# Patient Record
Sex: Male | Born: 1975 | Race: White | Hispanic: No | Smoking: Never smoker
Health system: Southern US, Community
[De-identification: ages and names within clinical notes are randomized; demographics above are authoritative.]

## PROBLEM LIST (undated history)

## (undated) DIAGNOSIS — K420 Umbilical hernia with obstruction, without gangrene: Secondary | ICD-10-CM

## (undated) DIAGNOSIS — R011 Cardiac murmur, unspecified: Secondary | ICD-10-CM

## (undated) HISTORY — DX: Cardiac murmur, unspecified: R01.1

---
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
## 2011-01-05 ENCOUNTER — Encounter: Payer: Self-pay | Admitting: Internal Medicine

## 2011-01-05 ENCOUNTER — Ambulatory Visit (INDEPENDENT_AMBULATORY_CARE_PROVIDER_SITE_OTHER): Payer: Managed Care, Other (non HMO) | Admitting: Internal Medicine

## 2011-01-05 VITALS — BP 124/80 | HR 86 | Temp 98.3°F | Resp 18 | Ht 71.0 in | Wt 196.0 lb

## 2011-01-05 DIAGNOSIS — Z0000 Encounter for general adult medical examination without abnormal findings: Secondary | ICD-10-CM

## 2011-01-05 HISTORY — PX: NO PAST SURGERIES: SHX2092

## 2011-01-05 NOTE — Patient Instructions (Addendum)
Please schedule cbc, chem7, lft, lipid, tsh, urinalysis v70.0 for this coming Monday (01/08/2011) Please schedule same labs for next year's physical

## 2011-01-05 NOTE — Progress Notes (Signed)
  Subjective:    Patient ID: David [name], male    DOB: 06-Jan-1976, 35 y.o.   MRN: [id number]  HPI Pt presents to clinic for physical exam. Has no current active complaints. Has h/o mild hyperlipidemia that has not required medication in the past. Performs regular exercise.   Reviewed pmh, psh, medications, allergies, soc hx and fam hx    Review of Systems  Respiratory: Negative for cough and shortness of breath.   Cardiovascular: Negative for chest pain.  Gastrointestinal: Negative for abdominal pain.  All other systems reviewed and are negative.       Objective:   Physical Exam  Physical Exam  Nursing note and vitals reviewed. Constitutional: He appears well-developed and well-nourished. No distress.  HENT:  Head: Normocephalic and atraumatic.  Right Ear: Tympanic membrane and external ear normal.  Left Ear: Tympanic membrane and external ear normal.  Nose: Nose normal.  Mouth/Throat: Uvula is midline, oropharynx is clear and moist and mucous membranes are normal. No oropharyngeal exudate.  Eyes: Conjunctivae and EOM are normal. Pupils are equal, round, and reactive to light. Right eye exhibits no discharge. Left eye exhibits no discharge. No scleral icterus.  Neck: Neck supple. Carotid bruit is not present. No thyromegaly present.  Cardiovascular: Normal rate, regular rhythm and normal heart sounds.  Exam reveals no gallop and no friction rub.   No murmur heard. Pulmonary/Chest: Effort normal and breath sounds normal. No respiratory distress. He has no wheezes. He has no rales.  Abdominal: Soft. He exhibits no distension and no mass. There is no hepatosplenomegaly. There is no tenderness. There is no rebound. Hernia confirmed negative in the right inguinal area and confirmed negative in the left inguinal area.  Genitourinary: Rectum normal, prostate normal and testes normal. Rectal exam shows no mass and no tenderness. Guaiac negative stool. Prostate is not enlarged and not  tender. Right testis shows no mass, no swelling and no tenderness. Right testis is descended. Left testis shows no mass, no swelling and no tenderness. Left testis is descended.  Lymphadenopathy:    He has no cervical adenopathy.       Right: No inguinal adenopathy present.       Left: No inguinal adenopathy present.  Neurological: He is alert.  Skin: Skin is warm and dry. He is not diaphoretic.  Psychiatric: He has a normal mood and affect.        Assessment & Plan:

## 2011-01-07 DIAGNOSIS — Z0000 Encounter for general adult medical examination without abnormal findings: Secondary | ICD-10-CM | POA: Insufficient documentation

## 2011-01-07 NOTE — Assessment & Plan Note (Signed)
Nl exam. Obtain screening labs. 

## 2011-01-08 ENCOUNTER — Other Ambulatory Visit: Payer: Self-pay | Admitting: Internal Medicine

## 2011-01-08 DIAGNOSIS — Z0000 Encounter for general adult medical examination without abnormal findings: Secondary | ICD-10-CM

## 2011-01-08 LAB — URINALYSIS, ROUTINE W REFLEX MICROSCOPIC
Bilirubin Urine: NEGATIVE
Nitrite: NEGATIVE
Specific Gravity, Urine: 1.026 (ref 1.005–1.030)
Urobilinogen, UA: 0.2 mg/dL (ref 0.0–1.0)
pH: 8 (ref 5.0–8.0)

## 2011-01-08 LAB — HEPATIC FUNCTION PANEL
ALT: 56 U/L — ABNORMAL HIGH (ref 0–53)
Albumin: 4.6 g/dL (ref 3.5–5.2)
Indirect Bilirubin: 0.4 mg/dL (ref 0.0–0.9)
Total Protein: 7.3 g/dL (ref 6.0–8.3)

## 2011-01-08 LAB — CBC
HCT: 50.4 % (ref 39.0–52.0)
Hemoglobin: 17.3 g/dL — ABNORMAL HIGH (ref 13.0–17.0)
MCH: 31.5 pg (ref 26.0–34.0)
MCV: 91.8 fL (ref 78.0–100.0)
Platelets: 157 10*3/uL (ref 150–400)
RBC: 5.49 MIL/uL (ref 4.22–5.81)
WBC: 4.9 10*3/uL (ref 4.0–10.5)

## 2011-01-08 LAB — BASIC METABOLIC PANEL
BUN: 19 mg/dL (ref 6–23)
CO2: 28 mEq/L (ref 19–32)
Calcium: 9.7 mg/dL (ref 8.4–10.5)
Chloride: 103 mEq/L (ref 96–112)
Creat: 0.88 mg/dL (ref 0.50–1.35)
Glucose, Bld: 102 mg/dL — ABNORMAL HIGH (ref 70–99)

## 2011-01-08 LAB — LIPID PANEL
LDL Cholesterol: 159 mg/dL — ABNORMAL HIGH (ref 0–99)
Triglycerides: 157 mg/dL — ABNORMAL HIGH (ref <150)

## 2011-01-10 ENCOUNTER — Telehealth: Payer: Self-pay | Admitting: *Deleted

## 2011-01-10 DIAGNOSIS — R945 Abnormal results of liver function studies: Secondary | ICD-10-CM

## 2011-01-10 NOTE — Telephone Encounter (Signed)
Has elevated chol. Don't recommend medication at this level or his age. Low fat diet and regular aerobic exercise. Oatmeal may help. -mild elevation of liver tests. Recommend repeating in a few weeks lft dx-abn lft -blood sugar minimally above nl. 2 pts only. Attention to healthy low sugar/carb diet and exercise

## 2011-01-10 NOTE — Telephone Encounter (Signed)
Patient called requesting lab results. He was informed once labs have been reviewed by provider, the call would be returned to him.

## 2011-01-12 NOTE — Telephone Encounter (Signed)
Call placed to patient [phone], he was informed per Dr Rodena Medin instructions and has verbalized understanding. Lab order entered for December 2012.

## 2015-02-19 ENCOUNTER — Other Ambulatory Visit (HOSPITAL_COMMUNITY)
Admission: RE | Admit: 2015-02-19 | Discharge: 2015-02-19 | Disposition: A | Discharge status: Home / Self Care | Payer: BLUE CROSS/BLUE SHIELD | Source: Ambulatory Visit | Attending: Family Medicine | Admitting: Family Medicine

## 2015-02-19 DIAGNOSIS — E785 Hyperlipidemia, unspecified: Secondary | ICD-10-CM | POA: Insufficient documentation

## 2015-02-19 LAB — LIPID PANEL
CHOL/HDL RATIO: 5.2 ratio
CHOLESTEROL: 214 mg/dL — AB (ref 0–200)
HDL: 41 mg/dL (ref >40)
LDL CALC: 155 mg/dL — AB (ref 0–99)
TRIGLYCERIDES: 91 mg/dL (ref <150)
VLDL: 18 mg/dL (ref 0–40)

## 2017-11-20 ENCOUNTER — Encounter: Payer: Self-pay | Admitting: Podiatry

## 2017-11-20 ENCOUNTER — Ambulatory Visit (INDEPENDENT_AMBULATORY_CARE_PROVIDER_SITE_OTHER): Payer: BLUE CROSS/BLUE SHIELD | Admitting: Podiatry

## 2017-11-20 VITALS — BP 107/67 | HR 74 | Resp 16

## 2017-11-20 DIAGNOSIS — B351 Tinea unguium: Secondary | ICD-10-CM | POA: Diagnosis not present

## 2017-11-20 DIAGNOSIS — L608 Other nail disorders: Secondary | ICD-10-CM | POA: Diagnosis not present

## 2017-11-20 NOTE — Progress Notes (Signed)
   Subjective: 42 year old male presenting today as a new patient with a chief complaint of fungus to the first and second toenails of bilateral feet that have been present for the past two years. He states he has used a topical solution and taken Terbinafine in the past with no resolution of the symptoms. There are no modifying factors. Patient is here for further evaluation and treatment.   Past Medical History:  Diagnosis Date  . Heart murmur    onset at birth no porblems    Objective: Physical Exam General: The patient is alert and oriented x3 in no acute distress.  Dermatology: Hyperkeratotic, discolored, thickened, onychodystrophy of nails 1 and 2 noted bilaterally. Skin is warm, dry and supple bilateral lower extremities. Negative for open lesions or macerations.  Vascular: Palpable pedal pulses bilaterally. No edema or erythema noted. Capillary refill within normal limits.  Neurological: Epicritic and protective threshold grossly intact bilaterally.   Musculoskeletal Exam: Range of motion within normal limits to all pedal and ankle joints bilateral. Muscle strength 5/5 in all groups bilateral.   Assessment: #1 onychomycosis bilateral toenails 1 and 2 #2 hyperkeratotic nails bilateral  Plan of Care:  #1 Patient was evaluated. #2 Today nail biopsy was taken and sent to pathology for fungal culture. #3 We will contact the patient with results and further treatment options.  #4 Return to clinic as needed.    Felecia Shelling, DPM [facility]  Dr. Felecia Shelling, DPM    [address]                                        [place], [place] [address]                Office [phone]  Fax [phone]

## 2017-12-03 ENCOUNTER — Telehealth: Payer: Self-pay | Admitting: Podiatry

## 2017-12-03 NOTE — Telephone Encounter (Signed)
I saw Dr. Logan Bores a few weeks ago and he sent a toenail clipping out. I was wondering if the results are in yet? My call back number is [phone]. Thank you.

## 2017-12-05 NOTE — Telephone Encounter (Signed)
I called Tuesday morning about the status of my appointment and lab results from 02 October. If somebody could please call me back at [phone].

## 2018-01-22 DIAGNOSIS — N529 Male erectile dysfunction, unspecified: Secondary | ICD-10-CM | POA: Diagnosis not present

## 2018-01-22 DIAGNOSIS — Z0000 Encounter for general adult medical examination without abnormal findings: Secondary | ICD-10-CM | POA: Diagnosis not present

## 2018-01-22 DIAGNOSIS — Z6826 Body mass index (BMI) 26.0-26.9, adult: Secondary | ICD-10-CM | POA: Diagnosis not present

## 2018-01-24 DIAGNOSIS — D1801 Hemangioma of skin and subcutaneous tissue: Secondary | ICD-10-CM | POA: Diagnosis not present

## 2018-01-24 DIAGNOSIS — D225 Melanocytic nevi of trunk: Secondary | ICD-10-CM | POA: Diagnosis not present

## 2018-01-24 DIAGNOSIS — L814 Other melanin hyperpigmentation: Secondary | ICD-10-CM | POA: Diagnosis not present

## 2018-01-24 DIAGNOSIS — D485 Neoplasm of uncertain behavior of skin: Secondary | ICD-10-CM | POA: Diagnosis not present

## 2018-01-24 DIAGNOSIS — D1722 Benign lipomatous neoplasm of skin and subcutaneous tissue of left arm: Secondary | ICD-10-CM | POA: Diagnosis not present

## 2018-07-02 ENCOUNTER — Emergency Department (HOSPITAL_COMMUNITY): Payer: BLUE CROSS/BLUE SHIELD

## 2018-07-02 ENCOUNTER — Encounter (HOSPITAL_COMMUNITY): Payer: Self-pay

## 2018-07-02 ENCOUNTER — Emergency Department (HOSPITAL_COMMUNITY)
Admission: EM | Admit: 2018-07-02 | Discharge: 2018-07-03 | Disposition: A | Discharge status: Home / Self Care | Payer: BLUE CROSS/BLUE SHIELD | Attending: Emergency Medicine | Admitting: Emergency Medicine

## 2018-07-02 DIAGNOSIS — S52301A Unspecified fracture of shaft of right radius, initial encounter for closed fracture: Secondary | ICD-10-CM | POA: Diagnosis not present

## 2018-07-02 DIAGNOSIS — S52309A Unspecified fracture of shaft of unspecified radius, initial encounter for closed fracture: Secondary | ICD-10-CM | POA: Diagnosis not present

## 2018-07-02 DIAGNOSIS — S63071A Subluxation of distal end of right ulna, initial encounter: Secondary | ICD-10-CM | POA: Diagnosis not present

## 2018-07-02 DIAGNOSIS — S52591A Other fractures of lower end of right radius, initial encounter for closed fracture: Secondary | ICD-10-CM | POA: Diagnosis not present

## 2018-07-02 DIAGNOSIS — S6991XA Unspecified injury of right wrist, hand and finger(s), initial encounter: Secondary | ICD-10-CM | POA: Diagnosis present

## 2018-07-02 DIAGNOSIS — S63074A Dislocation of distal end of right ulna, initial encounter: Secondary | ICD-10-CM | POA: Diagnosis not present

## 2018-07-02 DIAGNOSIS — Z23 Encounter for immunization: Secondary | ICD-10-CM | POA: Diagnosis not present

## 2018-07-02 DIAGNOSIS — S59901A Unspecified injury of right elbow, initial encounter: Secondary | ICD-10-CM | POA: Diagnosis not present

## 2018-07-02 DIAGNOSIS — M25561 Pain in right knee: Secondary | ICD-10-CM | POA: Diagnosis not present

## 2018-07-02 DIAGNOSIS — Y9355 Activity, bike riding: Secondary | ICD-10-CM | POA: Diagnosis not present

## 2018-07-02 DIAGNOSIS — Z1159 Encounter for screening for other viral diseases: Secondary | ICD-10-CM | POA: Diagnosis not present

## 2018-07-02 DIAGNOSIS — Y999 Unspecified external cause status: Secondary | ICD-10-CM | POA: Diagnosis not present

## 2018-07-02 DIAGNOSIS — M25462 Effusion, left knee: Secondary | ICD-10-CM | POA: Diagnosis not present

## 2018-07-02 DIAGNOSIS — S8002XA Contusion of left knee, initial encounter: Secondary | ICD-10-CM | POA: Insufficient documentation

## 2018-07-02 DIAGNOSIS — Y929 Unspecified place or not applicable: Secondary | ICD-10-CM | POA: Diagnosis not present

## 2018-07-02 MED ORDER — MORPHINE SULFATE (PF) 4 MG/ML IV SOLN
4.0000 mg | Freq: Once | INTRAVENOUS | Status: AC
  Administered 2018-07-02: 4 mg via INTRAVENOUS
  Filled 2018-07-02: qty 1

## 2018-07-02 MED ORDER — OXYCODONE-ACETAMINOPHEN 5-325 MG PO TABS: 1.0000 | ORAL_TABLET | Freq: Four times a day (QID) | ORAL | 0 refills | Status: DC | PRN

## 2018-07-02 MED ORDER — OXYCODONE-ACETAMINOPHEN 5-325 MG PO TABS
1.0000 | ORAL_TABLET | Freq: Once | ORAL | Status: AC
  Administered 2018-07-02: 1 via ORAL
  Filled 2018-07-02: qty 1

## 2018-07-02 MED ORDER — TETANUS-DIPHTH-ACELL PERTUSSIS 5-2.5-18.5 LF-MCG/0.5 IM SUSP
0.5000 mL | Freq: Once | INTRAMUSCULAR | Status: AC
  Administered 2018-07-02: 0.5 mL via INTRAMUSCULAR
  Filled 2018-07-02: qty 0.5

## 2018-07-02 MED ORDER — ONDANSETRON HCL 4 MG/2ML IJ SOLN
4.0000 mg | Freq: Once | INTRAMUSCULAR | Status: AC
  Administered 2018-07-02: 4 mg via INTRAVENOUS
  Filled 2018-07-02: qty 2

## 2018-07-02 NOTE — ED Notes (Signed)
Cleaned wounds and wrapped with kerlix

## 2018-07-02 NOTE — ED Notes (Signed)
Paged ortho, states they will come to bedside shortly to apply sling
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
## 2018-07-02 NOTE — Discharge Instructions (Signed)
Tonight when you go to bed make sure if your arm is elevated up on a pillow to help with swelling.  You can take the pain medication every 4-6 hours 1 to 2 tablets as needed for the pain.  You will be seeing the hand specialist Dr. Orlan Leavens tomorrow at noon in the office and the plan will most likely be for surgery on Friday.  You have some swelling in the left knee but no sign of broken bones.  However it may be sore and sprained.
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
## 2018-07-03 ENCOUNTER — Other Ambulatory Visit (HOSPITAL_COMMUNITY)
Admission: RE | Admit: 2018-07-03 | Discharge: 2018-07-03 | Disposition: A | Discharge status: Home / Self Care | Payer: BLUE CROSS/BLUE SHIELD | Source: Ambulatory Visit | Attending: Orthopedic Surgery | Admitting: Orthopedic Surgery

## 2018-07-03 ENCOUNTER — Other Ambulatory Visit: Payer: Self-pay

## 2018-07-03 ENCOUNTER — Encounter (HOSPITAL_BASED_OUTPATIENT_CLINIC_OR_DEPARTMENT_OTHER): Payer: Self-pay | Admitting: *Deleted

## 2018-07-03 DIAGNOSIS — S52371A Galeazzi's fracture of right radius, initial encounter for closed fracture: Secondary | ICD-10-CM | POA: Diagnosis not present

## 2018-07-03 LAB — SARS CORONAVIRUS 2 BY RT PCR (HOSPITAL ORDER, PERFORMED IN ~~LOC~~ HOSPITAL LAB): SARS Coronavirus 2: NEGATIVE

## 2018-07-03 NOTE — ED Notes (Signed)
Patient verbalizes understanding of discharge instructions. Opportunity for questioning and answers were provided. Armband removed by staff, pt discharged from ED ambulatory with wife at ER exit to take pt home.
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
## 2018-07-03 NOTE — ED Provider Notes (Signed)
Patient signed out to me to recheck after splinting.  Splint has been applied and patient feels much improvement in his pain level.  He is able to move fingers without difficulty, no swelling, no loss of sensation.  He will be discharged, follow-up with Dr. Melvyn Novas tomorrow as arranged.   Gilda Crease, MD 07/03/18 David [name]

## 2018-07-03 NOTE — Progress Notes (Signed)
Orthopedic Tech Progress Note Patient Details:  David [name] 04/22/1975 [id number]  Ortho Devices Type of Ortho Device: Arm sling, Post (long arm) splint Ortho Device/Splint Location: rue Ortho Device/Splint Interventions: Ordered, Application, Adjustment   Post Interventions Patient Tolerated: Well Instructions Provided: Care of device, Adjustment of device   Trinna Post 07/03/2018, 12:39 AM

## 2018-07-04 ENCOUNTER — Other Ambulatory Visit: Payer: Self-pay

## 2018-07-04 ENCOUNTER — Encounter (HOSPITAL_BASED_OUTPATIENT_CLINIC_OR_DEPARTMENT_OTHER): Payer: Self-pay

## 2018-07-04 ENCOUNTER — Ambulatory Visit (HOSPITAL_BASED_OUTPATIENT_CLINIC_OR_DEPARTMENT_OTHER)
Admission: RE | Admit: 2018-07-04 | Discharge: 2018-07-04 | Disposition: A | Discharge status: Home / Self Care | Payer: BLUE CROSS/BLUE SHIELD | Attending: Orthopedic Surgery | Admitting: Orthopedic Surgery

## 2018-07-04 ENCOUNTER — Encounter (HOSPITAL_BASED_OUTPATIENT_CLINIC_OR_DEPARTMENT_OTHER)
Admission: RE | Disposition: A | Discharge status: Home / Self Care | Payer: Self-pay | Source: Home / Self Care | Attending: Orthopedic Surgery

## 2018-07-04 ENCOUNTER — Ambulatory Visit (HOSPITAL_BASED_OUTPATIENT_CLINIC_OR_DEPARTMENT_OTHER): Payer: BLUE CROSS/BLUE SHIELD | Admitting: Certified Registered Nurse Anesthetist

## 2018-07-04 DIAGNOSIS — S52371D Galeazzi's fracture of right radius, subsequent encounter for closed fracture with routine healing: Secondary | ICD-10-CM | POA: Diagnosis not present

## 2018-07-04 DIAGNOSIS — S52371A Galeazzi's fracture of right radius, initial encounter for closed fracture: Secondary | ICD-10-CM | POA: Insufficient documentation

## 2018-07-04 DIAGNOSIS — G8918 Other acute postprocedural pain: Secondary | ICD-10-CM | POA: Diagnosis not present

## 2018-07-04 HISTORY — PX: OPEN REDUCTION INTERNAL FIXATION (ORIF) DISTAL RADIAL FRACTURE: SHX5989

## 2018-07-04 SURGERY — OPEN REDUCTION INTERNAL FIXATION (ORIF) DISTAL RADIUS FRACTURE
Anesthesia: General | Site: Wrist | Laterality: Right

## 2018-07-04 MED ORDER — EPHEDRINE 5 MG/ML INJ
INTRAVENOUS | Status: AC
  Filled 2018-07-04: qty 10

## 2018-07-04 MED ORDER — FENTANYL CITRATE (PF) 100 MCG/2ML IJ SOLN
INTRAMUSCULAR | Status: AC
  Filled 2018-07-04: qty 2

## 2018-07-04 MED ORDER — CLONIDINE HCL (ANALGESIA) 100 MCG/ML EP SOLN
EPIDURAL | Status: DC | PRN
  Administered 2018-07-04: 50 ug

## 2018-07-04 MED ORDER — ROPIVACAINE HCL 7.5 MG/ML IJ SOLN
INTRAMUSCULAR | Status: DC | PRN
  Administered 2018-07-04: 20 mL via PERINEURAL

## 2018-07-04 MED ORDER — CEFAZOLIN SODIUM-DEXTROSE 2-4 GM/100ML-% IV SOLN
INTRAVENOUS | Status: AC
  Filled 2018-07-04: qty 100

## 2018-07-04 MED ORDER — MIDAZOLAM HCL 2 MG/2ML IJ SOLN
INTRAMUSCULAR | Status: AC
  Filled 2018-07-04: qty 2

## 2018-07-04 MED ORDER — FENTANYL CITRATE (PF) 100 MCG/2ML IJ SOLN: 25.0000 ug | INTRAMUSCULAR | Status: DC | PRN

## 2018-07-04 MED ORDER — PROMETHAZINE HCL 25 MG/ML IJ SOLN: 6.2500 mg | INTRAMUSCULAR | Status: DC | PRN

## 2018-07-04 MED ORDER — ONDANSETRON HCL 4 MG/2ML IJ SOLN
INTRAMUSCULAR | Status: AC
  Filled 2018-07-04: qty 2

## 2018-07-04 MED ORDER — ONDANSETRON HCL 4 MG/2ML IJ SOLN
INTRAMUSCULAR | Status: DC | PRN
  Administered 2018-07-04: 4 mg via INTRAVENOUS

## 2018-07-04 MED ORDER — CEFAZOLIN SODIUM-DEXTROSE 2-4 GM/100ML-% IV SOLN
2.0000 g | INTRAVENOUS | Status: AC
  Administered 2018-07-04: 2 g via INTRAVENOUS

## 2018-07-04 MED ORDER — DEXAMETHASONE SODIUM PHOSPHATE 10 MG/ML IJ SOLN
INTRAMUSCULAR | Status: AC
  Filled 2018-07-04: qty 1

## 2018-07-04 MED ORDER — CHLORHEXIDINE GLUCONATE 4 % EX LIQD: 60.0000 mL | Freq: Once | CUTANEOUS | Status: DC

## 2018-07-04 MED ORDER — DEXAMETHASONE SODIUM PHOSPHATE 10 MG/ML IJ SOLN
INTRAMUSCULAR | Status: DC | PRN
  Administered 2018-07-04: 10 mg via INTRAVENOUS

## 2018-07-04 MED ORDER — MIDAZOLAM HCL 2 MG/2ML IJ SOLN
1.0000 mg | INTRAMUSCULAR | Status: DC | PRN
  Administered 2018-07-04: 1 mg via INTRAVENOUS
  Administered 2018-07-04: 12:00:00 2 mg via INTRAVENOUS

## 2018-07-04 MED ORDER — SUCCINYLCHOLINE CHLORIDE 200 MG/10ML IV SOSY
PREFILLED_SYRINGE | INTRAVENOUS | Status: AC
  Filled 2018-07-04: qty 10

## 2018-07-04 MED ORDER — FENTANYL CITRATE (PF) 100 MCG/2ML IJ SOLN
50.0000 ug | INTRAMUSCULAR | Status: DC | PRN
  Administered 2018-07-04: 50 ug via INTRAVENOUS
  Administered 2018-07-04: 12:00:00 100 ug via INTRAVENOUS

## 2018-07-04 MED ORDER — PHENYLEPHRINE 40 MCG/ML (10ML) SYRINGE FOR IV PUSH (FOR BLOOD PRESSURE SUPPORT)
PREFILLED_SYRINGE | INTRAVENOUS | Status: AC
  Filled 2018-07-04: qty 10

## 2018-07-04 MED ORDER — SCOPOLAMINE 1 MG/3DAYS TD PT72: 1.0000 | MEDICATED_PATCH | Freq: Once | TRANSDERMAL | Status: DC | PRN

## 2018-07-04 MED ORDER — LIDOCAINE 2% (20 MG/ML) 5 ML SYRINGE
INTRAMUSCULAR | Status: AC
  Filled 2018-07-04: qty 5

## 2018-07-04 MED ORDER — LACTATED RINGERS IV SOLN
INTRAVENOUS | Status: DC
  Administered 2018-07-04 (×2): via INTRAVENOUS

## 2018-07-04 SURGICAL SUPPLY — 62 items
BANDAGE ACE 4X5 VEL STRL LF (GAUZE/BANDAGES/DRESSINGS) ×6 IMPLANT
BIT DRILL 110X2.5XQCK CNCT (BIT) IMPLANT
BIT DRILL 2.5 (BIT) ×2
BIT DRL 110X2.5XQCK CNCT (BIT) ×1
BLADE SURG 15 STRL LF DISP TIS (BLADE) ×1 IMPLANT
BLADE SURG 15 STRL SS (BLADE) ×2
BNDG ESMARK 4X9 LF (GAUZE/BANDAGES/DRESSINGS) ×3 IMPLANT
BNDG GAUZE ELAST 4 BULKY (GAUZE/BANDAGES/DRESSINGS) ×3 IMPLANT
CANISTER SUCT 1200ML W/VALVE (MISCELLANEOUS) IMPLANT
CORD BIPOLAR FORCEPS 12FT (ELECTRODE) ×3 IMPLANT
COVER BACK TABLE REUSABLE LG (DRAPES) ×3 IMPLANT
COVER WAND RF STERILE (DRAPES) IMPLANT
CUFF TOURN SGL QUICK 18X4 (TOURNIQUET CUFF) ×3 IMPLANT
DECANTER SPIKE VIAL GLASS SM (MISCELLANEOUS) IMPLANT
DRAPE EXTREMITY T 121X128X90 (DISPOSABLE) ×3 IMPLANT
DRAPE OEC MINIVIEW 54X84 (DRAPES) ×3 IMPLANT
DRSG EMULSION OIL 3X3 NADH (GAUZE/BANDAGES/DRESSINGS) ×3 IMPLANT
GAUZE SPONGE 4X4 12PLY STRL (GAUZE/BANDAGES/DRESSINGS) ×3 IMPLANT
GLOVE BIOGEL PI IND STRL 6.5 (GLOVE) ×1 IMPLANT
GLOVE BIOGEL PI IND STRL 8.5 (GLOVE) ×1 IMPLANT
GLOVE BIOGEL PI INDICATOR 6.5 (GLOVE) ×2
GLOVE BIOGEL PI INDICATOR 8.5 (GLOVE) ×2
GLOVE ECLIPSE 6.5 STRL STRAW (GLOVE) ×3 IMPLANT
GLOVE SURG ORTHO 8.0 STRL STRW (GLOVE) ×3 IMPLANT
GOWN STRL REUS W/ TWL XL LVL3 (GOWN DISPOSABLE) ×1 IMPLANT
GOWN STRL REUS W/TWL XL LVL3 (GOWN DISPOSABLE) ×2
NDL HYPO 25X1 1.5 SAFETY (NEEDLE) IMPLANT
NEEDLE HYPO 25X1 1.5 SAFETY (NEEDLE) IMPLANT
NS IRRIG 1000ML POUR BTL (IV SOLUTION) ×3 IMPLANT
PACK BASIN DAY SURGERY FS (CUSTOM PROCEDURE TRAY) ×3 IMPLANT
PAD CAST 4YDX4 CTTN HI CHSV (CAST SUPPLIES) ×2 IMPLANT
PADDING CAST ABS 4INX4YD NS (CAST SUPPLIES) ×2
PADDING CAST ABS COTTON 4X4 ST (CAST SUPPLIES) ×1 IMPLANT
PADDING CAST COTTON 4X4 STRL (CAST SUPPLIES) ×4
PLATE 6H COMP LOCKING DUAL 3.5 (Plate) ×2 IMPLANT
SCREW CORTICAL 3.5 16MM (Screw) ×10 IMPLANT
SCREW CORTICAL 3.5 18MM (Screw) ×2 IMPLANT
SLEEVE SCD COMPRESS KNEE MED (MISCELLANEOUS) ×3 IMPLANT
SLING ARM FOAM STRAP LRG (SOFTGOODS) IMPLANT
SLING ARM MED ADULT FOAM STRAP (SOFTGOODS) IMPLANT
SPLINT FIBERGLASS 3X35 (CAST SUPPLIES) IMPLANT
SPLINT FIBERGLASS 4X30 (CAST SUPPLIES) IMPLANT
STOCKINETTE 4X48 STRL (DRAPES) ×3 IMPLANT
SUCTION FRAZIER HANDLE 10FR (MISCELLANEOUS)
SUCTION TUBE FRAZIER 10FR DISP (MISCELLANEOUS) IMPLANT
SUT MNCRL AB 3-0 PS2 18 (SUTURE) IMPLANT
SUT MON AB 3-0 SH 27 (SUTURE)
SUT MON AB 3-0 SH27 (SUTURE) IMPLANT
SUT PROLENE 3 0 PS 1 (SUTURE) IMPLANT
SUT PROLENE 4 0 PS 2 18 (SUTURE) ×3 IMPLANT
SUT VIC AB 0 CT1 27 (SUTURE)
SUT VIC AB 0 CT1 27XBRD ANBCTR (SUTURE) IMPLANT
SUT VIC AB 2-0 PS2 27 (SUTURE) ×3 IMPLANT
SUT VIC AB 2-0 SH 27 (SUTURE)
SUT VIC AB 2-0 SH 27XBRD (SUTURE) IMPLANT
SUT VICRYL 4-0 PS2 18IN ABS (SUTURE) ×3 IMPLANT
SYR BULB 3OZ (MISCELLANEOUS) ×3 IMPLANT
SYR CONTROL 10ML LL (SYRINGE) IMPLANT
TOWEL GREEN STERILE FF (TOWEL DISPOSABLE) ×3 IMPLANT
TUBE CONNECTING 20'X1/4 (TUBING)
TUBE CONNECTING 20X1/4 (TUBING) IMPLANT
UNDERPAD 30X30 (UNDERPADS AND DIAPERS) ×3 IMPLANT

## 2018-07-04 NOTE — Progress Notes (Signed)
Assisted Dr. Turk with right, ultrasound guided, supraclavicular block. Side rails up, monitors on throughout procedure. See vital signs in flow sheet. Tolerated Procedure well. 

## 2018-07-04 NOTE — Anesthesia Postprocedure Evaluation (Signed)
Anesthesia Post Note  Patient: David [name]  Procedure(s) Performed: OPEN REDUCTION INTERNAL FIXATION (ORIF) of Galeazzi fracture (Right Wrist)     Patient location during evaluation: PACU Anesthesia Type: General Level of consciousness: awake and alert Pain management: pain level controlled Vital Signs Assessment: post-procedure vital signs reviewed and stable Respiratory status: spontaneous breathing, nonlabored ventilation and respiratory function stable Cardiovascular status: blood pressure returned to baseline and stable Postop Assessment: no apparent nausea or vomiting Anesthetic complications: no    Last Vitals:  Vitals:   07/04/18 1515 07/04/18 1530  BP: 118/69 127/69  Pulse: 78 83  Resp: 19 18  Temp:  37.1 C  SpO2: 95% 95%    Last Pain:  Vitals:   07/04/18 1530  TempSrc: Oral  PainSc: 0-No pain                 Cecile Hearing

## 2018-07-04 NOTE — Transfer of Care (Signed)
Immediate Anesthesia Transfer of Care Note  Patient: David [name]  Procedure(s) Performed: OPEN REDUCTION INTERNAL FIXATION (ORIF) of Galeazzi fracture (Right Wrist)  Patient Location: PACU  Anesthesia Type:GA combined with regional for post-op pain  Level of Consciousness: sedated  Airway & Oxygen Therapy: Patient Spontanous Breathing and Patient connected to face mask oxygen  Post-op Assessment: Report given to RN and Post -op Vital signs reviewed and stable  Post vital signs: Reviewed and stable  Last Vitals:  Vitals Value Taken Time  BP    Temp    Pulse 75 07/04/2018  2:06 PM  Resp 13 07/04/2018  2:06 PM  SpO2 98 % 07/04/2018  2:06 PM  Vitals shown include unvalidated device data.  Last Pain:  Vitals:   07/04/18 1051  TempSrc: Oral  PainSc: 6       Patients Stated Pain Goal: 4 (07/04/18 1051)  Complications: No apparent anesthesia complications

## 2018-07-04 NOTE — Anesthesia Procedure Notes (Signed)
Anesthesia Regional Block: Supraclavicular block   Pre-Anesthetic Checklist: ,, timeout performed, Correct Patient, Correct Site, Correct Laterality, Correct Procedure, Correct Position, site marked, Risks and benefits discussed,  Surgical consent,  Pre-op evaluation,  At surgeon's request and post-op pain management  Laterality: Right  Prep: chloraprep       Needles:  Injection technique: Single-shot  Needle Type: Echogenic Needle     Needle Length: 9cm  Needle Gauge: 21     Additional Needles:   Procedures:,,,, ultrasound used (permanent image in chart),,,,  Narrative:  Start time: 07/04/2018 12:08 PM End time: 07/04/2018 12:13 PM Injection made incrementally with aspirations every 5 mL.  Performed by: Personally  Anesthesiologist: Cecile Hearing, MD  Additional Notes: No pain on injection. No increased resistance to injection. Injection made in 5cc increments.  Good needle visualization.  Patient tolerated procedure well.

## 2018-07-04 NOTE — Anesthesia Preprocedure Evaluation (Signed)
Anesthesia Evaluation  Patient identified by MRN, date of birth, ID band Patient awake    Reviewed: Allergy & Precautions, NPO status , Patient's Chart, lab work & pertinent test results  Airway Mallampati: II  TM Distance: >3 FB Neck ROM: Full    Dental  (+) Teeth Intact, Dental Advisory Given   Pulmonary neg pulmonary ROS,    Pulmonary exam normal breath sounds clear to auscultation       Cardiovascular negative cardio ROS Normal cardiovascular exam Rhythm:Regular Rate:Normal     Neuro/Psych negative neurological ROS  negative psych ROS   GI/Hepatic negative GI ROS, Neg liver ROS,   Endo/Other  negative endocrine ROS  Renal/GU negative Renal ROS     Musculoskeletal negative musculoskeletal ROS (+)   Abdominal   Peds  Hematology negative hematology ROS (+)   Anesthesia Other Findings Day of surgery medications reviewed with the patient.  Reproductive/Obstetrics                             Anesthesia Physical Anesthesia Plan  ASA: I  Anesthesia Plan: General   Post-op Pain Management:  Regional for Post-op pain   Induction: Intravenous  PONV Risk Score and Plan: 2 and Dexamethasone, Ondansetron and Midazolam  Airway Management Planned: LMA  Additional Equipment:   Intra-op Plan:   Post-operative Plan: Extubation in OR  Informed Consent: I have reviewed the patients History and Physical, chart, labs and discussed the procedure including the risks, benefits and alternatives for the proposed anesthesia with the patient or authorized representative who has indicated his/her understanding and acceptance.     Dental advisory given  Plan Discussed with: CRNA  Anesthesia Plan Comments:         Anesthesia Quick Evaluation
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
## 2018-07-04 NOTE — Discharge Instructions (Signed)
KEEP BANDAGE CLEAN AND DRY CALL OFFICE FOR F/U APPT [phone] in 13 days rx [facility] [address] [facility] CELL [phone] KEEP HAND ELEVATED ABOVE HEART OK TO APPLY ICE TO OPERATIVE AREA CONTACT OFFICE IF ANY WORSENING PAIN OR CONCERNS.    Post Anesthesia Home Care Instructions  Activity: Get plenty of rest for the remainder of the day. A responsible individual must stay with you for 24 hours following the procedure.  For the next 24 hours, DO NOT: -Drive a car -Advertising copywriter -Drink alcoholic beverages -Take any medication unless instructed by your physician -Make any legal decisions or sign important papers.  Meals: Start with liquid foods such as gelatin or soup. Progress to regular foods as tolerated. Avoid greasy, spicy, heavy foods. If nausea and/or vomiting occur, drink only clear liquids until the nausea and/or vomiting subsides. Call your physician if vomiting continues.  Special Instructions/Symptoms: Your throat may feel dry or sore from the anesthesia or the breathing tube placed in your throat during surgery. If this causes discomfort, gargle with warm salt water. The discomfort should disappear within 24 hours.  If you had a scopolamine patch placed behind your ear for the management of post- operative nausea and/or vomiting:  1. The medication in the patch is effective for 72 hours, after which it should be removed.  Wrap patch in a tissue and discard in the trash. Wash hands thoroughly with soap and water. 2. You may remove the patch earlier than 72 hours if you experience unpleasant side effects which may include dry mouth, dizziness or visual disturbances. 3. Avoid touching the patch. Wash your hands with soap and water after contact with the patch.      Regional Anesthesia Blocks  1. Numbness or the inability to move the "blocked" extremity may last from 3-48 hours after placement. The length of time depends on the medication injected and your  individual response to the medication. If the numbness is not going away after 48 hours, call your surgeon.  2. The extremity that is blocked will need to be protected until the numbness is gone and the  Strength has returned. Because you cannot feel it, you will need to take extra care to avoid injury. Because it may be weak, you may have difficulty moving it or using it. You may not know what position it is in without looking at it while the block is in effect.  3. For blocks in the legs and feet, returning to weight bearing and walking needs to be done carefully. You will need to wait until the numbness is entirely gone and the strength has returned. You should be able to move your leg and foot normally before you try and bear weight or walk. You will need someone to be with you when you first try to ensure you do not fall and possibly risk injury.  4. Bruising and tenderness at the needle site are common side effects and will resolve in a few days.  5. Persistent numbness or new problems with movement should be communicated to the surgeon or the [facility] [phone] [facility] [phone]).

## 2018-07-04 NOTE — Anesthesia Procedure Notes (Signed)
Procedure Name: LMA Insertion Date/Time: 07/04/2018 12:58 PM Performed by: Ronnette Hila, CRNA Pre-anesthesia Checklist: Patient identified, Emergency Drugs available, Suction available and Patient being monitored Patient Re-evaluated:Patient Re-evaluated prior to induction Oxygen Delivery Method: Circle system utilized Preoxygenation: Pre-oxygenation with 100% oxygen Induction Type: IV induction Ventilation: Mask ventilation without difficulty LMA: LMA inserted LMA Size: 5.0 Number of attempts: 1 Airway Equipment and Method: Bite block Placement Confirmation: positive ETCO2 Tube secured with: Tape Dental Injury: Teeth and Oropharynx as per pre-operative assessment

## 2018-07-09 ENCOUNTER — Encounter (HOSPITAL_BASED_OUTPATIENT_CLINIC_OR_DEPARTMENT_OTHER): Payer: Self-pay | Admitting: Orthopedic Surgery

## 2018-07-17 DIAGNOSIS — S52371D Galeazzi's fracture of right radius, subsequent encounter for closed fracture with routine healing: Secondary | ICD-10-CM | POA: Diagnosis not present

## 2018-07-24 ENCOUNTER — Other Ambulatory Visit: Payer: Self-pay | Admitting: General Surgery

## 2018-07-24 DIAGNOSIS — K420 Umbilical hernia with obstruction, without gangrene: Secondary | ICD-10-CM | POA: Diagnosis not present

## 2018-07-25 DIAGNOSIS — S52371A Galeazzi's fracture of right radius, initial encounter for closed fracture: Secondary | ICD-10-CM | POA: Diagnosis not present

## 2018-07-25 DIAGNOSIS — M25631 Stiffness of right wrist, not elsewhere classified: Secondary | ICD-10-CM | POA: Diagnosis not present

## 2018-08-01 DIAGNOSIS — M25631 Stiffness of right wrist, not elsewhere classified: Secondary | ICD-10-CM | POA: Diagnosis not present

## 2018-08-06 DIAGNOSIS — M25631 Stiffness of right wrist, not elsewhere classified: Secondary | ICD-10-CM | POA: Diagnosis not present

## 2018-08-08 DIAGNOSIS — M25631 Stiffness of right wrist, not elsewhere classified: Secondary | ICD-10-CM | POA: Diagnosis not present

## 2018-08-18 DIAGNOSIS — M25631 Stiffness of right wrist, not elsewhere classified: Secondary | ICD-10-CM | POA: Diagnosis not present

## 2018-08-20 DIAGNOSIS — M25631 Stiffness of right wrist, not elsewhere classified: Secondary | ICD-10-CM | POA: Diagnosis not present

## 2018-08-21 ENCOUNTER — Encounter (HOSPITAL_BASED_OUTPATIENT_CLINIC_OR_DEPARTMENT_OTHER): Payer: Self-pay | Admitting: *Deleted

## 2018-08-21 ENCOUNTER — Other Ambulatory Visit: Payer: Self-pay
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
## 2018-08-26 ENCOUNTER — Other Ambulatory Visit (HOSPITAL_COMMUNITY)
Admission: RE | Admit: 2018-08-26 | Discharge: 2018-08-26 | Disposition: A | Discharge status: Home / Self Care | Payer: BC Managed Care – PPO | Source: Ambulatory Visit | Attending: General Surgery | Admitting: General Surgery

## 2018-08-26 DIAGNOSIS — M25631 Stiffness of right wrist, not elsewhere classified: Secondary | ICD-10-CM | POA: Diagnosis not present

## 2018-08-26 DIAGNOSIS — Z01812 Encounter for preprocedural laboratory examination: Secondary | ICD-10-CM | POA: Insufficient documentation

## 2018-08-26 DIAGNOSIS — Z1159 Encounter for screening for other viral diseases: Secondary | ICD-10-CM | POA: Insufficient documentation

## 2018-08-26 LAB — SARS CORONAVIRUS 2 (TAT 6-24 HRS): SARS Coronavirus 2: NEGATIVE

## 2018-08-28 DIAGNOSIS — M25631 Stiffness of right wrist, not elsewhere classified: Secondary | ICD-10-CM | POA: Diagnosis not present

## 2018-08-28 DIAGNOSIS — S52371A Galeazzi's fracture of right radius, initial encounter for closed fracture: Secondary | ICD-10-CM | POA: Diagnosis not present

## 2018-08-28 NOTE — Progress Notes (Signed)
Ensure pre surgery drink given with instructions, pt verbalized understanding. 

## 2018-08-28 NOTE — Anesthesia Preprocedure Evaluation (Addendum)
Anesthesia Evaluation  Patient identified by MRN, date of birth, ID band Patient awake    Reviewed: Allergy & Precautions, NPO status , Patient's Chart, lab work & pertinent test results  History of Anesthesia Complications Negative for: history of anesthetic complications  Airway Mallampati: I  TM Distance: >3 FB Neck ROM: Full    Dental  (+) Dental Advisory Given, Teeth Intact   Pulmonary neg pulmonary ROS,    breath sounds clear to auscultation       Cardiovascular negative cardio ROS   Rhythm:Regular Rate:Normal     Neuro/Psych negative neurological ROS  negative psych ROS   GI/Hepatic negative GI ROS, Neg liver ROS,   Endo/Other  negative endocrine ROS  Renal/GU negative Renal ROS     Musculoskeletal negative musculoskeletal ROS (+)   Abdominal   Peds  Hematology negative hematology ROS (+)   Anesthesia Other Findings   Reproductive/Obstetrics                            Anesthesia Physical Anesthesia Plan  ASA: I  Anesthesia Plan: General   Post-op Pain Management:    Induction: Intravenous  PONV Risk Score and Plan: 3 and Treatment may vary due to age or medical condition, Ondansetron, Scopolamine patch - Pre-op, Dexamethasone and Midazolam  Airway Management Planned: LMA  Additional Equipment: None  Intra-op Plan:   Post-operative Plan: Extubation in OR  Informed Consent: I have reviewed the patients History and Physical, chart, labs and discussed the procedure including the risks, benefits and alternatives for the proposed anesthesia with the patient or authorized representative who has indicated his/her understanding and acceptance.     Dental advisory given  Plan Discussed with: CRNA and Anesthesiologist  Anesthesia Plan Comments: ( ETT if surgeon requesting relaxation, otherwise LMA )       Anesthesia Quick Evaluation

## 2018-08-29 ENCOUNTER — Ambulatory Visit (HOSPITAL_BASED_OUTPATIENT_CLINIC_OR_DEPARTMENT_OTHER): Payer: BC Managed Care – PPO | Admitting: Anesthesiology

## 2018-08-29 ENCOUNTER — Encounter (HOSPITAL_BASED_OUTPATIENT_CLINIC_OR_DEPARTMENT_OTHER)
Admission: RE | Disposition: A | Discharge status: Home / Self Care | Payer: Self-pay | Source: Home / Self Care | Attending: General Surgery

## 2018-08-29 ENCOUNTER — Other Ambulatory Visit: Payer: Self-pay

## 2018-08-29 ENCOUNTER — Encounter (HOSPITAL_BASED_OUTPATIENT_CLINIC_OR_DEPARTMENT_OTHER): Payer: Self-pay | Admitting: *Deleted

## 2018-08-29 ENCOUNTER — Ambulatory Visit (HOSPITAL_BASED_OUTPATIENT_CLINIC_OR_DEPARTMENT_OTHER)
Admission: RE | Admit: 2018-08-29 | Discharge: 2018-08-29 | Disposition: A | Discharge status: Home / Self Care | Payer: BC Managed Care – PPO | Attending: General Surgery | Admitting: General Surgery

## 2018-08-29 DIAGNOSIS — K420 Umbilical hernia with obstruction, without gangrene: Secondary | ICD-10-CM | POA: Diagnosis present

## 2018-08-29 HISTORY — PX: UMBILICAL HERNIA REPAIR: SHX196

## 2018-08-29 HISTORY — DX: Umbilical hernia with obstruction, without gangrene: K42.0

## 2018-08-29 SURGERY — REPAIR, HERNIA, UMBILICAL, ADULT
Anesthesia: General | Site: Abdomen

## 2018-08-29 MED ORDER — GABAPENTIN 300 MG PO CAPS
300.0000 mg | ORAL_CAPSULE | ORAL | Status: AC
  Administered 2018-08-29: 300 mg via ORAL

## 2018-08-29 MED ORDER — SUGAMMADEX SODIUM 200 MG/2ML IV SOLN
INTRAVENOUS | Status: DC | PRN
  Administered 2018-08-29: 200 mg via INTRAVENOUS

## 2018-08-29 MED ORDER — MIDAZOLAM HCL 5 MG/5ML IJ SOLN
INTRAMUSCULAR | Status: DC | PRN
  Administered 2018-08-29: 2 mg via INTRAVENOUS

## 2018-08-29 MED ORDER — FENTANYL CITRATE (PF) 100 MCG/2ML IJ SOLN
INTRAMUSCULAR | Status: AC
  Filled 2018-08-29: qty 2

## 2018-08-29 MED ORDER — ACETAMINOPHEN 500 MG PO TABS
1000.0000 mg | ORAL_TABLET | ORAL | Status: AC
  Administered 2018-08-29: 1000 mg via ORAL

## 2018-08-29 MED ORDER — LIDOCAINE 2% (20 MG/ML) 5 ML SYRINGE
INTRAMUSCULAR | Status: AC
  Filled 2018-08-29: qty 5

## 2018-08-29 MED ORDER — OXYCODONE HCL 5 MG PO TABS
ORAL_TABLET | ORAL | Status: AC
  Filled 2018-08-29: qty 1

## 2018-08-29 MED ORDER — PROPOFOL 500 MG/50ML IV EMUL
INTRAVENOUS | Status: AC
  Filled 2018-08-29: qty 50

## 2018-08-29 MED ORDER — DEXAMETHASONE SODIUM PHOSPHATE 10 MG/ML IJ SOLN
INTRAMUSCULAR | Status: AC
  Filled 2018-08-29: qty 1

## 2018-08-29 MED ORDER — PHENYLEPHRINE 40 MCG/ML (10ML) SYRINGE FOR IV PUSH (FOR BLOOD PRESSURE SUPPORT)
PREFILLED_SYRINGE | INTRAVENOUS | Status: AC
  Filled 2018-08-29: qty 10

## 2018-08-29 MED ORDER — FENTANYL CITRATE (PF) 100 MCG/2ML IJ SOLN: 50.0000 ug | INTRAMUSCULAR | Status: DC | PRN

## 2018-08-29 MED ORDER — PHENYLEPHRINE 40 MCG/ML (10ML) SYRINGE FOR IV PUSH (FOR BLOOD PRESSURE SUPPORT)
PREFILLED_SYRINGE | INTRAVENOUS | Status: DC | PRN
  Administered 2018-08-29: 80 ug via INTRAVENOUS

## 2018-08-29 MED ORDER — LIDOCAINE 2% (20 MG/ML) 5 ML SYRINGE
INTRAMUSCULAR | Status: DC | PRN
  Administered 2018-08-29: 80 mg via INTRAVENOUS

## 2018-08-29 MED ORDER — PROMETHAZINE HCL 25 MG/ML IJ SOLN: 6.2500 mg | INTRAMUSCULAR | Status: DC | PRN

## 2018-08-29 MED ORDER — CEFAZOLIN SODIUM-DEXTROSE 2-4 GM/100ML-% IV SOLN
INTRAVENOUS | Status: AC
  Filled 2018-08-29: qty 100

## 2018-08-29 MED ORDER — MIDAZOLAM HCL 2 MG/2ML IJ SOLN
INTRAMUSCULAR | Status: AC
  Filled 2018-08-29: qty 2

## 2018-08-29 MED ORDER — LACTATED RINGERS IV SOLN
INTRAVENOUS | Status: DC
  Administered 2018-08-29 (×2): via INTRAVENOUS

## 2018-08-29 MED ORDER — BUPIVACAINE-EPINEPHRINE 0.5% -1:200000 IJ SOLN
INTRAMUSCULAR | Status: DC | PRN
  Administered 2018-08-29: 10 mL

## 2018-08-29 MED ORDER — ROCURONIUM BROMIDE 10 MG/ML (PF) SYRINGE
PREFILLED_SYRINGE | INTRAVENOUS | Status: DC | PRN
  Administered 2018-08-29: 50 mg via INTRAVENOUS

## 2018-08-29 MED ORDER — DEXAMETHASONE SODIUM PHOSPHATE 10 MG/ML IJ SOLN
INTRAMUSCULAR | Status: DC | PRN
  Administered 2018-08-29: 5 mg via INTRAVENOUS

## 2018-08-29 MED ORDER — FENTANYL CITRATE (PF) 250 MCG/5ML IJ SOLN
INTRAMUSCULAR | Status: DC | PRN
  Administered 2018-08-29: 100 ug via INTRAVENOUS

## 2018-08-29 MED ORDER — CHLORHEXIDINE GLUCONATE CLOTH 2 % EX PADS: 6.0000 | MEDICATED_PAD | Freq: Once | CUTANEOUS | Status: DC

## 2018-08-29 MED ORDER — OXYCODONE HCL 5 MG PO TABS
5.0000 mg | ORAL_TABLET | Freq: Once | ORAL | Status: AC | PRN
  Administered 2018-08-29: 5 mg via ORAL

## 2018-08-29 MED ORDER — MIDAZOLAM HCL 2 MG/2ML IJ SOLN: 1.0000 mg | INTRAMUSCULAR | Status: DC | PRN

## 2018-08-29 MED ORDER — GABAPENTIN 300 MG PO CAPS
ORAL_CAPSULE | ORAL | Status: AC
  Filled 2018-08-29: qty 1

## 2018-08-29 MED ORDER — PROPOFOL 10 MG/ML IV BOLUS
INTRAVENOUS | Status: DC | PRN
  Administered 2018-08-29: 200 mg via INTRAVENOUS

## 2018-08-29 MED ORDER — SODIUM CHLORIDE 0.9% FLUSH: 3.0000 mL | Freq: Two times a day (BID) | INTRAVENOUS | Status: DC

## 2018-08-29 MED ORDER — OXYCODONE HCL 5 MG/5ML PO SOLN: 5.0000 mg | Freq: Once | ORAL | Status: AC | PRN

## 2018-08-29 MED ORDER — ROCURONIUM BROMIDE 10 MG/ML (PF) SYRINGE
PREFILLED_SYRINGE | INTRAVENOUS | Status: AC
  Filled 2018-08-29: qty 10

## 2018-08-29 MED ORDER — PROPOFOL 10 MG/ML IV BOLUS
INTRAVENOUS | Status: AC
  Filled 2018-08-29: qty 40

## 2018-08-29 MED ORDER — ONDANSETRON HCL 4 MG/2ML IJ SOLN
INTRAMUSCULAR | Status: AC
  Filled 2018-08-29: qty 2

## 2018-08-29 MED ORDER — CEFAZOLIN SODIUM-DEXTROSE 2-4 GM/100ML-% IV SOLN
2.0000 g | INTRAVENOUS | Status: AC
  Administered 2018-08-29: 2 g via INTRAVENOUS

## 2018-08-29 MED ORDER — FENTANYL CITRATE (PF) 100 MCG/2ML IJ SOLN
25.0000 ug | INTRAMUSCULAR | Status: DC | PRN
  Administered 2018-08-29 (×2): 25 ug via INTRAVENOUS
  Administered 2018-08-29: 50 ug via INTRAVENOUS
  Administered 2018-08-29 (×2): 25 ug via INTRAVENOUS

## 2018-08-29 MED ORDER — HYDROCODONE-ACETAMINOPHEN 5-325 MG PO TABS: 1.0000 | ORAL_TABLET | Freq: Four times a day (QID) | ORAL | 0 refills | Status: AC | PRN

## 2018-08-29 MED ORDER — CELECOXIB 200 MG PO CAPS
200.0000 mg | ORAL_CAPSULE | ORAL | Status: AC
  Administered 2018-08-29: 200 mg via ORAL

## 2018-08-29 MED ORDER — ONDANSETRON HCL 4 MG/2ML IJ SOLN
INTRAMUSCULAR | Status: DC | PRN
  Administered 2018-08-29: 4 mg via INTRAVENOUS

## 2018-08-29 MED ORDER — CELECOXIB 200 MG PO CAPS
ORAL_CAPSULE | ORAL | Status: AC
  Filled 2018-08-29: qty 1

## 2018-08-29 MED ORDER — ACETAMINOPHEN 500 MG PO TABS
ORAL_TABLET | ORAL | Status: AC
  Filled 2018-08-29: qty 2

## 2018-08-29 MED ORDER — DEXAMETHASONE SODIUM PHOSPHATE 4 MG/ML IJ SOLN: 4.0000 mg | INTRAMUSCULAR | Status: DC

## 2018-08-29 SURGICAL SUPPLY — 46 items
APPLICATOR COTTON TIP 6 STRL (MISCELLANEOUS) ×1 IMPLANT
APPLICATOR COTTON TIP 6IN STRL (MISCELLANEOUS) ×3
BLADE CLIPPER SURG (BLADE) ×3 IMPLANT
BLADE HEX COATED 2.75 (ELECTRODE) ×3 IMPLANT
BLADE SURG 10 STRL SS (BLADE) ×3 IMPLANT
BLADE SURG 15 STRL LF DISP TIS (BLADE) ×1 IMPLANT
BLADE SURG 15 STRL SS (BLADE) ×2
CANISTER SUCT 1200ML W/VALVE (MISCELLANEOUS) ×3 IMPLANT
CHLORAPREP W/TINT 26 (MISCELLANEOUS) ×3 IMPLANT
COVER BACK TABLE REUSABLE LG (DRAPES) ×3 IMPLANT
COVER MAYO STAND REUSABLE (DRAPES) ×3 IMPLANT
COVER WAND RF STERILE (DRAPES) IMPLANT
DECANTER SPIKE VIAL GLASS SM (MISCELLANEOUS) IMPLANT
DERMABOND ADVANCED (GAUZE/BANDAGES/DRESSINGS) ×2
DERMABOND ADVANCED .7 DNX12 (GAUZE/BANDAGES/DRESSINGS) ×1 IMPLANT
DRAPE LAPAROTOMY 100X72 PEDS (DRAPES) ×3 IMPLANT
DRAPE UTILITY XL STRL (DRAPES) ×3 IMPLANT
ELECT REM PT RETURN 9FT ADLT (ELECTROSURGICAL) ×3
ELECTRODE REM PT RTRN 9FT ADLT (ELECTROSURGICAL) ×1 IMPLANT
GAUZE SPONGE 4X4 12PLY STRL LF (GAUZE/BANDAGES/DRESSINGS) IMPLANT
GLOVE BIO SURGEON STRL SZ 6.5 (GLOVE) ×2 IMPLANT
GLOVE BIO SURGEONS STRL SZ 6.5 (GLOVE) ×1
GLOVE BIOGEL PI IND STRL 6.5 (GLOVE) ×1 IMPLANT
GLOVE BIOGEL PI INDICATOR 6.5 (GLOVE) ×2
GLOVE EUDERMIC 7 POWDERFREE (GLOVE) ×3 IMPLANT
GOWN STRL REUS W/ TWL LRG LVL3 (GOWN DISPOSABLE) IMPLANT
GOWN STRL REUS W/ TWL XL LVL3 (GOWN DISPOSABLE) ×1 IMPLANT
GOWN STRL REUS W/TWL LRG LVL3 (GOWN DISPOSABLE) ×3 IMPLANT
GOWN STRL REUS W/TWL XL LVL3 (GOWN DISPOSABLE) ×2
MESH VENTRALEX ST 1-7/10 CRC S (Mesh General) ×3 IMPLANT
NEEDLE HYPO 25X1 1.5 SAFETY (NEEDLE) ×3 IMPLANT
PACK BASIN DAY SURGERY FS (CUSTOM PROCEDURE TRAY) ×3 IMPLANT
PENCIL BUTTON HOLSTER BLD 10FT (ELECTRODE) ×3 IMPLANT
SLEEVE SCD COMPRESS KNEE MED (MISCELLANEOUS) ×3 IMPLANT
SPONGE LAP 4X18 RFD (DISPOSABLE) ×3 IMPLANT
SUT MNCRL AB 4-0 PS2 18 (SUTURE) ×3 IMPLANT
SUT NOVA NAB DX-16 0-1 5-0 T12 (SUTURE) ×6 IMPLANT
SUT VICRYL 3-0 CR8 SH (SUTURE) ×3 IMPLANT
SUT VICRYL AB 2 0 TIE (SUTURE) IMPLANT
SUT VICRYL AB 2 0 TIES (SUTURE)
SYR 10ML LL (SYRINGE) ×3 IMPLANT
SYR BULB 3OZ (MISCELLANEOUS) IMPLANT
TOWEL GREEN STERILE FF (TOWEL DISPOSABLE) ×6 IMPLANT
TUBE CONNECTING 20'X1/4 (TUBING) ×1
TUBE CONNECTING 20X1/4 (TUBING) ×2 IMPLANT
YANKAUER SUCT BULB TIP NO VENT (SUCTIONS) ×3 IMPLANT

## 2018-08-29 NOTE — Op Note (Signed)
Patient Name:           David [name]   Date of Surgery:        08/29/2018  Pre op Diagnosis:    Incarcerated umbilical hernia  Post op Diagnosis:    Incarcerated umbilical hernia  Procedure:                 Open repair incarcerated umbilical hernia, implantation mesh  Surgeon:                     Angelia MouldHaywood M. Derrell LollingIngram, M.D., FACS  Assistant:               Or staff  Operative Indications:    This is a healthy 43 year old man, self-referred for management of symptomatic umbilical hernia.       He is noticed an umbilical hernia for about 2 years. Does weight training in the gym. That is off the table for now because of his recent wrist fracture. He says the hernia has been getting larger and becoming more painful. He pops it back in when he needs to. No prior history of hernia surgery. He wants to get the hernia repaired while he is recovering from his forearm fracture.     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 Discussed the technique. I reviewed the patient information booklet. Talked about mesh. He is motivated to go ahead and have this repaired. This is a relatively small hernia .Marland Kitchen. This will be amenable to open repair with inlay mesh. He will be scheduled for open repair of incarcerated umbilical hernia with mesh.  Operative Findings:       He had an incarcerated umbilical hernia.  The defect was less than 2 cm but there was incarcerated preperitoneal fat and the hernia sac about 2-1/2 cm in size which had to be debrided.  I repaired the hernia with an underlay mesh, 4.3 cm ventral Lex ST circular disc.  Procedure in Detail:          Following the induction of general endotracheal anesthesia the patient's abdomen was prepped and draped in a sterile fashion.  Surgical timeout was performed.  Intravenous antibiotics were given.  0.5% Marcaine with epinephrine was used as a local infiltration anesthetic.  A  transverse curvilinear incision was made at the lower rim of the umbilicus.  Dissection was carried down to the fascia.  I dissected out the very large hernia sac circumferentially.  I slowly entered the sac until I had good visualization of the peritoneal space and then I debrided the entirety of the hernia sac.  I could see the small bowel.  There was no injury or bleeding.  I inserted my finger and there was no other defects above or below and no adhesions.  I brought the 4.3 cm composite hernia just to the operative field.  Care was taken to position the mesh so that the smooth adhesion barrier was down toward the viscera and the rough surface up toward the abdominal wall muscles.  I placed 4 equidistant sutures of #1 Novafil, down through the fascia, through the edge of the mesh and then back up through the fascia.  After all 4 of these were placed it was inspected and there was no overlap or twisting.  The mesh was moistened and inserted and the sutures lifted up.  I had excellent deployment of the  mesh without any gaps.  The sutures were tied down.  The fascia was then closed over on top of the mesh with interrupted #1 Novafil.  The umbilicus was tacked down to the fascia with a 3-0 Vicryl suture.  The subcutaneous tissue was closed with 3-0 Vicryl sutures and the skin closed with a running subcuticular 4-0 Monocryl and Dermabond.  The patient tolerated the procedure well and was taken to PACU in stable condition.  EBL 10 cc.  Counts correct.  Complications none.    Addendum: I logged onto the PMP aware website and reviewed his prescription medication history     Donnia Poplaski M. Dalbert Batman, M.D., FACS General and Minimally Invasive Surgery Breast and Colorectal Surgery  08/29/2018 9:38 AM

## 2018-08-29 NOTE — Discharge Instructions (Signed)
No Tylenol until after 1:45pm, no ibuprofen until after 3:45pm   [facility] _______[facility]  UMBILICAL HERNIA REPAIR: POST OP INSTRUCTIONS  Always review your discharge instruction sheet given to you by the facility where your surgery was performed. IF YOU HAVE DISABILITY OR FAMILY LEAVE FORMS, YOU MUST BRING THEM TO THE OFFICE FOR PROCESSING.   DO NOT GIVE THEM TO YOUR DOCTOR.  1. A  prescription for pain medication may be given to you upon discharge.  Take your pain medication as prescribed, if needed.  If narcotic pain medicine is not needed, then you may take acetaminophen (Tylenol) or ibuprofen (Advil) as needed. 2. Take your usually prescribed medications unless otherwise directed. If you need a refill on your pain medication, please contact your pharmacy.  They will contact our office to request authorization. Prescriptions will not be filled after 5 pm or on week-ends. 3. You should follow a light diet the first 24 hours after arrival home, such as soup and crackers, etc.  Be sure to include lots of fluids daily.  Resume your normal diet the day after surgery. 4.Most patients will experience some swelling and bruising around the umbilicus  Ice packs and reclining will help.  Swelling and bruising can take several days to resolve.  6. It is common to experience some constipation if taking pain medication after surgery.  Increasing fluid intake and taking a stool softener (such as Colace) will usually help or prevent this problem from occurring.  A mild laxative (Milk of Magnesia or Miralax) should be taken according to package directions if there are no bowel movements after 48 hours. 7. Unless discharge instructions indicate otherwise, you may remove your bandages 24-48 hours after surgery, and you may shower at that time.  You may have steri-strips (small skin tapes) in place directly over the incision.  These strips should be left on the skin for 7-10 days.  If your surgeon  used skin glue on the incision, you may shower in 24 hours.  The glue will flake off over the next 2-3 weeks.  Any sutures or staples will be removed at the office during your follow-up visit. 8. ACTIVITIES:  You may resume regular (light) daily activities beginning the next day--such as daily self-care, walking, climbing stairs--gradually increasing activities as tolerated.  You may have sexual intercourse when it is comfortable.  Refrain from any heavy lifting or straining until approved by your doctor.  a.You may drive when you are no longer taking prescription pain medication, you can comfortably wear a seatbelt, and you can safely maneuver your car and apply brakes. b.RETURN TO WORK:   _____________________________________________  9.You should see your doctor in the office for a follow-up appointment approximately 2-3 weeks after your surgery.  Make sure that you call for this appointment within a day or two after you arrive home to insure a convenient appointment time. 10.OTHER INSTRUCTIONS: _________________________    _____________________________________  WHEN TO CALL YOUR DOCTOR: 1. Fever over 101.0 2. Inability to urinate 3. Nausea and/or vomiting 4. Extreme swelling or bruising 5. Continued bleeding from incision. 6. Increased pain, redness, or drainage from the incision  The clinic staff is available to answer your questions during regular business hours.  Please dont hesitate to call and ask to speak to one of the nurses for clinical concerns.  If you have a medical emergency, go to the nearest emergency room or call 911.  A surgeon from [facility] is always on call at  the hospital   [address], [address], [place], [place]  [id number] ?  [address] [address], [place], [place]   [id number] [phone] ? [phone] ? FAX [phone] Web site: [email or web address]     Post Anesthesia Home Care Instructions  Activity: Get plenty of rest for the  remainder of the day. A responsible individual must stay with you for 24 hours following the procedure.  For the next 24 hours, DO NOT: -Drive a car -Advertising copywriterperate machinery -Drink alcoholic beverages -Take any medication unless instructed by your physician -Make any legal decisions or sign important papers.  Meals: Start with liquid foods such as gelatin or soup. Progress to regular foods as tolerated. Avoid greasy, spicy, heavy foods. If nausea and/or vomiting occur, drink only clear liquids until the nausea and/or vomiting subsides. Call your physician if vomiting continues.  Special Instructions/Symptoms: Your throat may feel dry or sore from the anesthesia or the breathing tube placed in your throat during surgery. If this causes discomfort, gargle with warm salt water. The discomfort should disappear within 24 hours.  If you had a scopolamine patch placed behind your ear for the management of post- operative nausea and/or vomiting:  1. The medication in the patch is effective for 72 hours, after which it should be removed.  Wrap patch in a tissue and discard in the trash. Wash hands thoroughly with soap and water. 2. You may remove the patch earlier than 72 hours if you experience unpleasant side effects which may include dry mouth, dizziness or visual disturbances. 3. Avoid touching the patch. Wash your hands with soap and water after contact with the patch.           Managing Your Pain After Surgery Without Opioids    Thank you for participating in our program to help patients manage their pain after surgery without opioids. This is part of our effort to provide you with the best care possible, without exposing you or your family to the risk that opioids pose.  What pain can I expect after surgery? You can expect to have some pain after surgery. This is normal. The pain is typically worse the day after surgery, and quickly begins to get better. Many studies have found  that many patients are able to manage their pain after surgery with Over-the-Counter (OTC) medications such as Tylenol and Motrin. If you have a condition that does not allow you to take Tylenol or Motrin, notify your surgical team.  How will I manage my pain? The best strategy for controlling your pain after surgery is around the clock pain control with Tylenol (acetaminophen) and Motrin (ibuprofen or Advil). Alternating these medications with each other allows you to maximize your pain control. In addition to Tylenol and Motrin, you can use heating pads or ice packs on your incisions to help reduce your pain.  How will I alternate your regular strength over-the-counter pain medication? You will take a dose of pain medication every three hours. ; Start by taking 650 mg of Tylenol (2 pills of 325 mg) ; 3 hours later take 600 mg of Motrin (3 pills of 200 mg) ; 3 hours after taking the Motrin take 650 mg of Tylenol ; 3 hours after that take 600 mg of Motrin.   - 1 -  See example - if your first dose of Tylenol is at 12:00 PM   12:00 PM Tylenol 650 mg (2 pills of 325 mg)  3:00 PM Motrin 600 mg (3 pills of  200 mg)  6:00 PM Tylenol 650 mg (2 pills of 325 mg)  9:00 PM Motrin 600 mg (3 pills of 200 mg)  Continue alternating every 3 hours   We recommend that you follow this schedule around-the-clock for at least 3 days after surgery, or until you feel that it is no longer needed. Use the table on the last page of this handout to keep track of the medications you are taking. Important: Do not take more than 3000mg  of Tylenol or 3200mg  of Motrin in a 24-hour period. Do not take ibuprofen/Motrin if you have a history of bleeding stomach ulcers, severe kidney disease, &/or actively taking a blood thinner  What if I still have pain? If you have pain that is not controlled with the over-the-counter pain medications (Tylenol and Motrin or Advil) you might have what we call breakthrough pain. You  will receive a prescription for a small amount of an opioid pain medication such as Oxycodone, Tramadol, or Tylenol with Codeine. Use these opioid pills in the first 24 hours after surgery if you have breakthrough pain. Do not take more than 1 pill every 4-6 hours.  If you still have uncontrolled pain after using all opioid pills, don't hesitate to call our staff using the number provided. We will help make sure you are managing your pain in the best way possible, and if necessary, we can provide a prescription for additional pain medication.   Day 1    Time  Name of Medication Number of pills taken  Amount of Acetaminophen  Pain Level   Comments  AM PM       AM PM       AM PM       AM PM       AM PM       AM PM       AM PM       AM PM       Total Daily amount of Acetaminophen Do not take more than  3,000 mg per day      Day 2    Time  Name of Medication Number of pills taken  Amount of Acetaminophen  Pain Level   Comments  AM PM       AM PM       AM PM       AM PM       AM PM       AM PM       AM PM       AM PM       Total Daily amount of Acetaminophen Do not take more than  3,000 mg per day      Day 3    Time  Name of Medication Number of pills taken  Amount of Acetaminophen  Pain Level   Comments  AM PM       AM PM       AM PM       AM PM          AM PM       AM PM       AM PM       AM PM       Total Daily amount of Acetaminophen Do not take more than  3,000 mg per day      Day 4    Time  Name of Medication Number of pills taken  Amount of Acetaminophen  Pain Level  Comments  AM PM       AM PM       AM PM       AM PM       AM PM       AM PM       AM PM       AM PM       Total Daily amount of Acetaminophen Do not take more than  3,000 mg per day      Day 5    Time  Name of Medication Number of pills taken  Amount of Acetaminophen  Pain Level   Comments  AM PM       AM PM       AM PM       AM PM       AM PM         AM PM       AM PM       AM PM       Total Daily amount of Acetaminophen Do not take more than  3,000 mg per day       Day 6    Time  Name of Medication Number of pills taken  Amount of Acetaminophen  Pain Level  Comments  AM PM       AM PM       AM PM       AM PM       AM PM       AM PM       AM PM       AM PM       Total Daily amount of Acetaminophen Do not take more than  3,000 mg per day      Day 7    Time  Name of Medication Number of pills taken  Amount of Acetaminophen  Pain Level   Comments  AM PM       AM PM       AM PM       AM PM       AM PM       AM PM       AM PM       AM PM       Total Daily amount of Acetaminophen Do not take more than  3,000 mg per day        For additional information about how and where to safely dispose of unused opioid medications - [employer]  Disclaimer: This document contains information and/or instructional materials adapted from [employer] for the typical patient with your condition. It does not replace medical advice from your health care provider because your experience may differ from that of the typical patient. Talk to your health care provider if you have any questions about this document, your condition or your treatment plan. Adapted from [employer]

## 2018-08-29 NOTE — Transfer of Care (Signed)
Immediate Anesthesia Transfer of Care Note  Patient: David [name]  Procedure(s) Performed: REPAIR INCARCERATED UMBILICAL HERNIA WITH MESH (N/A Abdomen)  Patient Location: PACU  Anesthesia Type:General  Level of Consciousness: awake, alert , oriented and patient cooperative  Airway & Oxygen Therapy: Patient Spontanous Breathing and Patient connected to face mask oxygen  Post-op Assessment: Report given to RN, Post -op Vital signs reviewed and stable and Patient moving all extremities  Post vital signs: Reviewed and stable  Last Vitals:  Vitals Value Taken Time  BP 113/73 08/29/18 0937  Temp    Pulse 75 08/29/18 0938  Resp 13 08/29/18 0938  SpO2 100 % 08/29/18 0938  Vitals shown include unvalidated device data.  Last Pain:  Vitals:   08/29/18 0733  TempSrc: Oral  PainSc: 0-No pain         Complications: No apparent anesthesia complications

## 2018-08-29 NOTE — Anesthesia Postprocedure Evaluation (Signed)
Anesthesia Post Note  Patient: David [name]  Procedure(s) Performed: REPAIR INCARCERATED UMBILICAL HERNIA WITH MESH (N/A Abdomen)     Patient location during evaluation: PACU Anesthesia Type: General Level of consciousness: awake and alert Pain management: pain level controlled Vital Signs Assessment: post-procedure vital signs reviewed and stable Respiratory status: spontaneous breathing, nonlabored ventilation and respiratory function stable Cardiovascular status: blood pressure returned to baseline and stable Postop Assessment: no apparent nausea or vomiting Anesthetic complications: no    Last Vitals:  Vitals:   08/29/18 1045 08/29/18 1102  BP: 117/74 121/78  Pulse: 64 71  Resp: 15 18  Temp:  36.6 C  SpO2: 100% 100%    Last Pain:  Vitals:   08/29/18 1102  TempSrc: Oral  PainSc: [place]

## 2018-08-29 NOTE — Anesthesia Procedure Notes (Signed)
Procedure Name: Intubation Date/Time: 08/29/2018 8:43 AM Performed by: Myna Bright, CRNA Pre-anesthesia Checklist: Patient identified, Suction available, Emergency Drugs available and Patient being monitored Patient Re-evaluated:Patient Re-evaluated prior to induction Oxygen Delivery Method: Circle system utilized Preoxygenation: Pre-oxygenation with 100% oxygen Induction Type: IV induction Ventilation: Mask ventilation without difficulty Laryngoscope Size: Mac and 4 Grade View: Grade II Tube type: Oral Tube size: 8.0 mm Number of attempts: 1 Airway Equipment and Method: Stylet Placement Confirmation: ETT inserted through vocal cords under direct vision,  positive ETCO2 and breath sounds checked- equal and bilateral Secured at: 22 cm Tube secured with: Tape Dental Injury: Teeth and Oropharynx as per pre-operative assessment

## 2018-08-29 NOTE — Interval H&P Note (Signed)
History and Physical Interval Note:  08/29/2018 8:16 AM  David [name]  has presented today for surgery, with the diagnosis of incarcerated umbilical hernia.  The various methods of treatment have been discussed with the patient and family. After consideration of risks, benefits and other options for treatment, the patient has consented to  Procedure(s): [place] (N/A) as a surgical intervention.  The patient's history has been reviewed, patient examined, no change in status, stable for surgery.  I have reviewed the patient's chart and labs.  Questions were answered to the patient's satisfaction.     Adin Hector

## 2018-09-01 ENCOUNTER — Encounter (HOSPITAL_BASED_OUTPATIENT_CLINIC_OR_DEPARTMENT_OTHER): Payer: Self-pay | Admitting: General Surgery

## 2018-09-03 DIAGNOSIS — M25631 Stiffness of right wrist, not elsewhere classified: Secondary | ICD-10-CM | POA: Diagnosis not present

## 2018-09-04 DIAGNOSIS — M25631 Stiffness of right wrist, not elsewhere classified: Secondary | ICD-10-CM | POA: Diagnosis not present

## 2018-09-08 DIAGNOSIS — M25631 Stiffness of right wrist, not elsewhere classified: Secondary | ICD-10-CM | POA: Diagnosis not present

## 2018-09-10 DIAGNOSIS — M25631 Stiffness of right wrist, not elsewhere classified: Secondary | ICD-10-CM | POA: Diagnosis not present

## 2018-09-16 DIAGNOSIS — S52371D Galeazzi's fracture of right radius, subsequent encounter for closed fracture with routine healing: Secondary | ICD-10-CM | POA: Diagnosis not present

## 2018-09-25 DIAGNOSIS — S52371D Galeazzi's fracture of right radius, subsequent encounter for closed fracture with routine healing: Secondary | ICD-10-CM | POA: Diagnosis not present

## 2018-09-25 DIAGNOSIS — S52371A Galeazzi's fracture of right radius, initial encounter for closed fracture: Secondary | ICD-10-CM | POA: Diagnosis not present

## 2018-12-04 DIAGNOSIS — S52371A Galeazzi's fracture of right radius, initial encounter for closed fracture: Secondary | ICD-10-CM | POA: Diagnosis not present

## 2019-01-01 DIAGNOSIS — L905 Scar conditions and fibrosis of skin: Secondary | ICD-10-CM | POA: Diagnosis not present

## 2019-01-01 DIAGNOSIS — L578 Other skin changes due to chronic exposure to nonionizing radiation: Secondary | ICD-10-CM | POA: Diagnosis not present

## 2019-01-01 DIAGNOSIS — D225 Melanocytic nevi of trunk: Secondary | ICD-10-CM | POA: Diagnosis not present

## 2019-01-01 DIAGNOSIS — L814 Other melanin hyperpigmentation: Secondary | ICD-10-CM | POA: Diagnosis not present

## 2019-01-29 DIAGNOSIS — E7849 Other hyperlipidemia: Secondary | ICD-10-CM | POA: Diagnosis not present

## 2019-01-29 DIAGNOSIS — E663 Overweight: Secondary | ICD-10-CM | POA: Diagnosis not present

## 2019-01-29 DIAGNOSIS — S5290XD Unspecified fracture of unspecified forearm, subsequent encounter for closed fracture with routine healing: Secondary | ICD-10-CM | POA: Diagnosis not present

## 2019-01-29 DIAGNOSIS — Z1389 Encounter for screening for other disorder: Secondary | ICD-10-CM | POA: Diagnosis not present

## 2019-02-02 DIAGNOSIS — E7849 Other hyperlipidemia: Secondary | ICD-10-CM | POA: Diagnosis not present

## 2019-02-02 DIAGNOSIS — Z0000 Encounter for general adult medical examination without abnormal findings: Secondary | ICD-10-CM | POA: Diagnosis not present

## 2019-02-02 DIAGNOSIS — R7989 Other specified abnormal findings of blood chemistry: Secondary | ICD-10-CM | POA: Diagnosis not present

## 2019-02-05 DIAGNOSIS — G479 Sleep disorder, unspecified: Secondary | ICD-10-CM | POA: Diagnosis not present

## 2019-02-05 DIAGNOSIS — R030 Elevated blood-pressure reading, without diagnosis of hypertension: Secondary | ICD-10-CM | POA: Diagnosis not present

## 2019-02-05 DIAGNOSIS — R351 Nocturia: Secondary | ICD-10-CM | POA: Diagnosis not present

## 2019-02-05 DIAGNOSIS — R7989 Other specified abnormal findings of blood chemistry: Secondary | ICD-10-CM | POA: Diagnosis not present

## 2019-02-05 DIAGNOSIS — Z23 Encounter for immunization: Secondary | ICD-10-CM | POA: Diagnosis not present

## 2019-02-05 DIAGNOSIS — Z0000 Encounter for general adult medical examination without abnormal findings: Secondary | ICD-10-CM | POA: Diagnosis not present

## 2020-01-12 ENCOUNTER — Other Ambulatory Visit: Payer: Self-pay | Admitting: Physician Assistant

## 2020-01-12 ENCOUNTER — Ambulatory Visit
Admission: RE | Admit: 2020-01-12 | Discharge: 2020-01-12 | Disposition: A | Discharge status: Home / Self Care | Payer: BC Managed Care – PPO | Source: Ambulatory Visit | Attending: Physician Assistant | Admitting: Physician Assistant

## 2020-01-12 DIAGNOSIS — M5441 Lumbago with sciatica, right side: Secondary | ICD-10-CM

## 2020-01-12 DIAGNOSIS — M545 Low back pain, unspecified: Secondary | ICD-10-CM | POA: Diagnosis not present

## 2020-02-03 DIAGNOSIS — M5459 Other low back pain: Secondary | ICD-10-CM | POA: Diagnosis not present

## 2020-02-24 DIAGNOSIS — M9904 Segmental and somatic dysfunction of sacral region: Secondary | ICD-10-CM | POA: Diagnosis not present

## 2020-02-24 DIAGNOSIS — M9903 Segmental and somatic dysfunction of lumbar region: Secondary | ICD-10-CM | POA: Diagnosis not present

## 2020-02-24 DIAGNOSIS — M5416 Radiculopathy, lumbar region: Secondary | ICD-10-CM | POA: Diagnosis not present

## 2020-02-24 DIAGNOSIS — M5136 Other intervertebral disc degeneration, lumbar region: Secondary | ICD-10-CM | POA: Diagnosis not present

## 2020-02-29 DIAGNOSIS — M9903 Segmental and somatic dysfunction of lumbar region: Secondary | ICD-10-CM | POA: Diagnosis not present

## 2020-02-29 DIAGNOSIS — M9904 Segmental and somatic dysfunction of sacral region: Secondary | ICD-10-CM | POA: Diagnosis not present

## 2020-02-29 DIAGNOSIS — M5136 Other intervertebral disc degeneration, lumbar region: Secondary | ICD-10-CM | POA: Diagnosis not present

## 2020-02-29 DIAGNOSIS — M5416 Radiculopathy, lumbar region: Secondary | ICD-10-CM | POA: Diagnosis not present

## 2020-03-02 DIAGNOSIS — M5416 Radiculopathy, lumbar region: Secondary | ICD-10-CM | POA: Diagnosis not present

## 2020-03-02 DIAGNOSIS — M9904 Segmental and somatic dysfunction of sacral region: Secondary | ICD-10-CM | POA: Diagnosis not present

## 2020-03-02 DIAGNOSIS — M5136 Other intervertebral disc degeneration, lumbar region: Secondary | ICD-10-CM | POA: Diagnosis not present

## 2020-03-02 DIAGNOSIS — M9903 Segmental and somatic dysfunction of lumbar region: Secondary | ICD-10-CM | POA: Diagnosis not present

## 2020-03-04 DIAGNOSIS — M9903 Segmental and somatic dysfunction of lumbar region: Secondary | ICD-10-CM | POA: Diagnosis not present

## 2020-03-04 DIAGNOSIS — M5136 Other intervertebral disc degeneration, lumbar region: Secondary | ICD-10-CM | POA: Diagnosis not present

## 2020-03-04 DIAGNOSIS — M5416 Radiculopathy, lumbar region: Secondary | ICD-10-CM | POA: Diagnosis not present

## 2020-03-04 DIAGNOSIS — M9904 Segmental and somatic dysfunction of sacral region: Secondary | ICD-10-CM | POA: Diagnosis not present

## 2020-03-09 DIAGNOSIS — M9904 Segmental and somatic dysfunction of sacral region: Secondary | ICD-10-CM | POA: Diagnosis not present

## 2020-03-09 DIAGNOSIS — M9903 Segmental and somatic dysfunction of lumbar region: Secondary | ICD-10-CM | POA: Diagnosis not present

## 2020-03-09 DIAGNOSIS — M5136 Other intervertebral disc degeneration, lumbar region: Secondary | ICD-10-CM | POA: Diagnosis not present

## 2020-03-09 DIAGNOSIS — M5416 Radiculopathy, lumbar region: Secondary | ICD-10-CM | POA: Diagnosis not present

## 2020-03-11 DIAGNOSIS — M9903 Segmental and somatic dysfunction of lumbar region: Secondary | ICD-10-CM | POA: Diagnosis not present

## 2020-03-11 DIAGNOSIS — M5136 Other intervertebral disc degeneration, lumbar region: Secondary | ICD-10-CM | POA: Diagnosis not present

## 2020-03-11 DIAGNOSIS — M9904 Segmental and somatic dysfunction of sacral region: Secondary | ICD-10-CM | POA: Diagnosis not present

## 2020-03-11 DIAGNOSIS — M5416 Radiculopathy, lumbar region: Secondary | ICD-10-CM | POA: Diagnosis not present

## 2020-03-14 DIAGNOSIS — M9904 Segmental and somatic dysfunction of sacral region: Secondary | ICD-10-CM | POA: Diagnosis not present

## 2020-03-14 DIAGNOSIS — M9903 Segmental and somatic dysfunction of lumbar region: Secondary | ICD-10-CM | POA: Diagnosis not present

## 2020-03-14 DIAGNOSIS — M5416 Radiculopathy, lumbar region: Secondary | ICD-10-CM | POA: Diagnosis not present

## 2020-03-14 DIAGNOSIS — M5136 Other intervertebral disc degeneration, lumbar region: Secondary | ICD-10-CM | POA: Diagnosis not present

## 2020-03-18 DIAGNOSIS — M5416 Radiculopathy, lumbar region: Secondary | ICD-10-CM | POA: Diagnosis not present

## 2020-03-18 DIAGNOSIS — M5136 Other intervertebral disc degeneration, lumbar region: Secondary | ICD-10-CM | POA: Diagnosis not present

## 2020-03-18 DIAGNOSIS — M9904 Segmental and somatic dysfunction of sacral region: Secondary | ICD-10-CM | POA: Diagnosis not present

## 2020-03-18 DIAGNOSIS — M9903 Segmental and somatic dysfunction of lumbar region: Secondary | ICD-10-CM | POA: Diagnosis not present

## 2020-03-24 DIAGNOSIS — M9904 Segmental and somatic dysfunction of sacral region: Secondary | ICD-10-CM | POA: Diagnosis not present

## 2020-03-24 DIAGNOSIS — M9903 Segmental and somatic dysfunction of lumbar region: Secondary | ICD-10-CM | POA: Diagnosis not present

## 2020-03-24 DIAGNOSIS — M5416 Radiculopathy, lumbar region: Secondary | ICD-10-CM | POA: Diagnosis not present

## 2020-03-24 DIAGNOSIS — M5136 Other intervertebral disc degeneration, lumbar region: Secondary | ICD-10-CM | POA: Diagnosis not present

## 2020-04-01 DIAGNOSIS — M9903 Segmental and somatic dysfunction of lumbar region: Secondary | ICD-10-CM | POA: Diagnosis not present

## 2020-04-01 DIAGNOSIS — M9904 Segmental and somatic dysfunction of sacral region: Secondary | ICD-10-CM | POA: Diagnosis not present

## 2020-04-01 DIAGNOSIS — M5416 Radiculopathy, lumbar region: Secondary | ICD-10-CM | POA: Diagnosis not present

## 2020-04-01 DIAGNOSIS — M5136 Other intervertebral disc degeneration, lumbar region: Secondary | ICD-10-CM | POA: Diagnosis not present

## 2020-04-12 DIAGNOSIS — M9904 Segmental and somatic dysfunction of sacral region: Secondary | ICD-10-CM | POA: Diagnosis not present

## 2020-04-12 DIAGNOSIS — M9903 Segmental and somatic dysfunction of lumbar region: Secondary | ICD-10-CM | POA: Diagnosis not present

## 2020-04-12 DIAGNOSIS — M5416 Radiculopathy, lumbar region: Secondary | ICD-10-CM | POA: Diagnosis not present

## 2020-04-12 DIAGNOSIS — M5136 Other intervertebral disc degeneration, lumbar region: Secondary | ICD-10-CM | POA: Diagnosis not present

## 2020-04-20 DIAGNOSIS — M545 Low back pain, unspecified: Secondary | ICD-10-CM | POA: Diagnosis not present

## 2020-04-20 DIAGNOSIS — E663 Overweight: Secondary | ICD-10-CM | POA: Diagnosis not present

## 2020-05-02 DIAGNOSIS — M9904 Segmental and somatic dysfunction of sacral region: Secondary | ICD-10-CM | POA: Diagnosis not present

## 2020-05-02 DIAGNOSIS — M9903 Segmental and somatic dysfunction of lumbar region: Secondary | ICD-10-CM | POA: Diagnosis not present

## 2020-05-02 DIAGNOSIS — M5416 Radiculopathy, lumbar region: Secondary | ICD-10-CM | POA: Diagnosis not present

## 2020-05-02 DIAGNOSIS — M5136 Other intervertebral disc degeneration, lumbar region: Secondary | ICD-10-CM | POA: Diagnosis not present

## 2020-05-14 DIAGNOSIS — M545 Low back pain, unspecified: Secondary | ICD-10-CM | POA: Diagnosis not present

## 2020-05-20 DIAGNOSIS — M5416 Radiculopathy, lumbar region: Secondary | ICD-10-CM | POA: Diagnosis not present

## 2020-06-07 DIAGNOSIS — M5416 Radiculopathy, lumbar region: Secondary | ICD-10-CM | POA: Diagnosis not present

## 2020-06-20 DIAGNOSIS — M5416 Radiculopathy, lumbar region: Secondary | ICD-10-CM | POA: Diagnosis not present

## 2020-09-02 DIAGNOSIS — M5416 Radiculopathy, lumbar region: Secondary | ICD-10-CM | POA: Diagnosis not present

## 2020-09-20 DIAGNOSIS — M5416 Radiculopathy, lumbar region: Secondary | ICD-10-CM | POA: Diagnosis not present

## 2020-10-10 DIAGNOSIS — M5416 Radiculopathy, lumbar region: Secondary | ICD-10-CM | POA: Diagnosis not present

## 2020-11-03 DIAGNOSIS — M545 Low back pain, unspecified: Secondary | ICD-10-CM | POA: Diagnosis not present

## 2020-11-03 DIAGNOSIS — M5416 Radiculopathy, lumbar region: Secondary | ICD-10-CM | POA: Diagnosis not present

## 2020-11-10 DIAGNOSIS — M5416 Radiculopathy, lumbar region: Secondary | ICD-10-CM | POA: Diagnosis not present

## 2020-11-10 DIAGNOSIS — M545 Low back pain, unspecified: Secondary | ICD-10-CM | POA: Diagnosis not present

## 2020-11-17 DIAGNOSIS — M5416 Radiculopathy, lumbar region: Secondary | ICD-10-CM | POA: Diagnosis not present

## 2020-11-17 DIAGNOSIS — M545 Low back pain, unspecified: Secondary | ICD-10-CM | POA: Diagnosis not present

## 2020-11-24 DIAGNOSIS — M545 Low back pain, unspecified: Secondary | ICD-10-CM | POA: Diagnosis not present

## 2020-11-24 DIAGNOSIS — M5416 Radiculopathy, lumbar region: Secondary | ICD-10-CM | POA: Diagnosis not present

## 2020-12-02 DIAGNOSIS — M545 Low back pain, unspecified: Secondary | ICD-10-CM | POA: Diagnosis not present

## 2020-12-02 DIAGNOSIS — M5416 Radiculopathy, lumbar region: Secondary | ICD-10-CM | POA: Diagnosis not present
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
## 2021-01-07 IMAGING — DX LEFT KNEE - COMPLETE 4+ VIEW
4 series · 4 of 4 positions shown · non-contrast
Comparison: None.

CLINICAL DATA: Left knee pain and swelling after injury.

EXAM:
LEFT KNEE - COMPLETE 4+ VIEW

[knee ap]
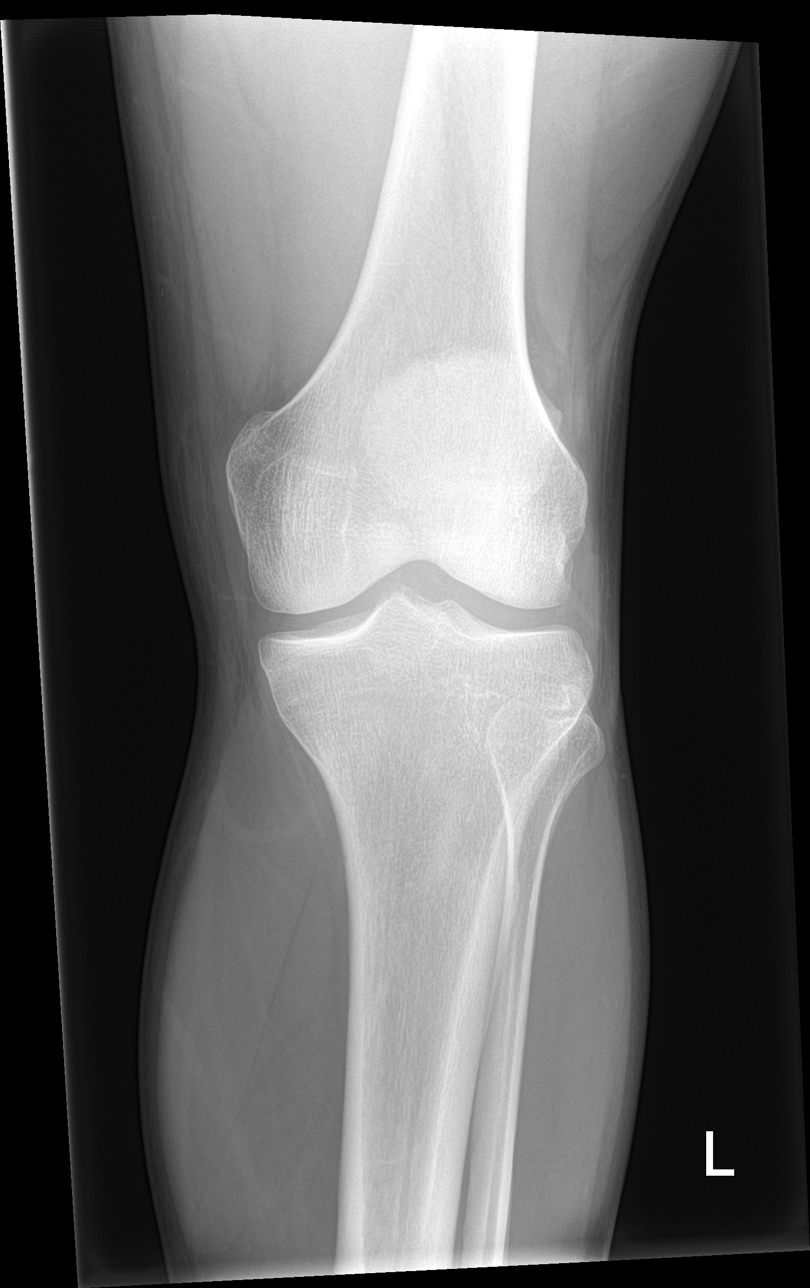

[knee lat]
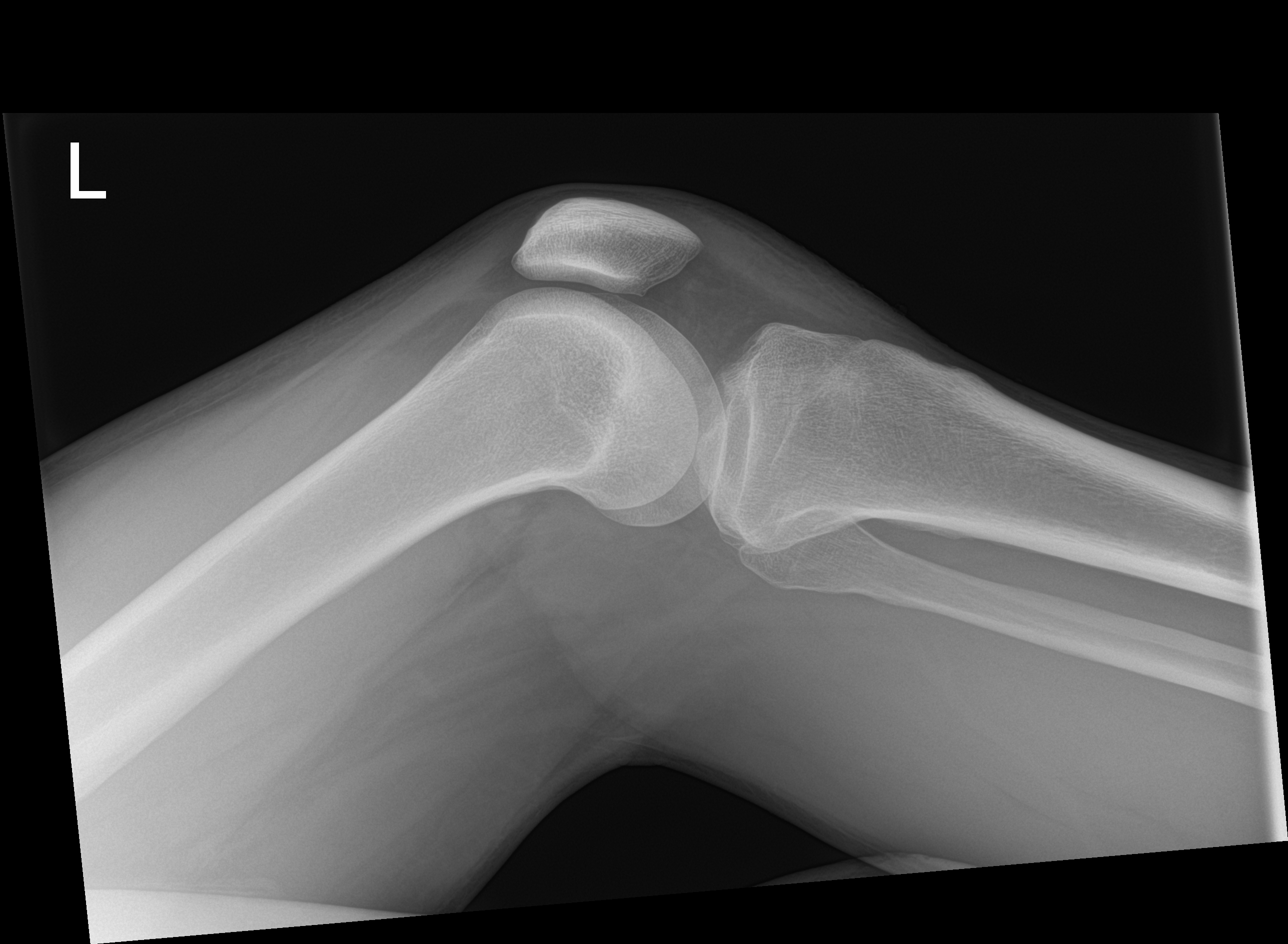

[knee obl (1 of 2)]
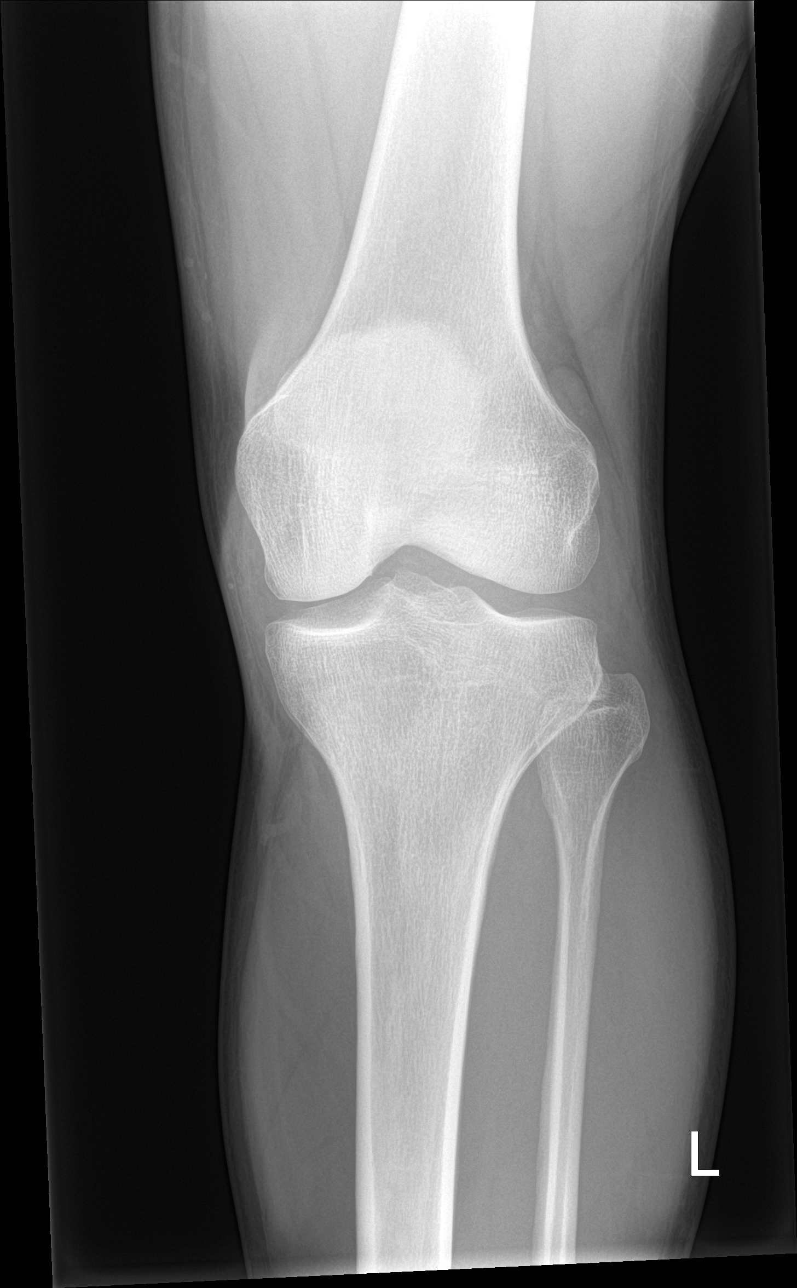

[knee obl (2 of 2)]
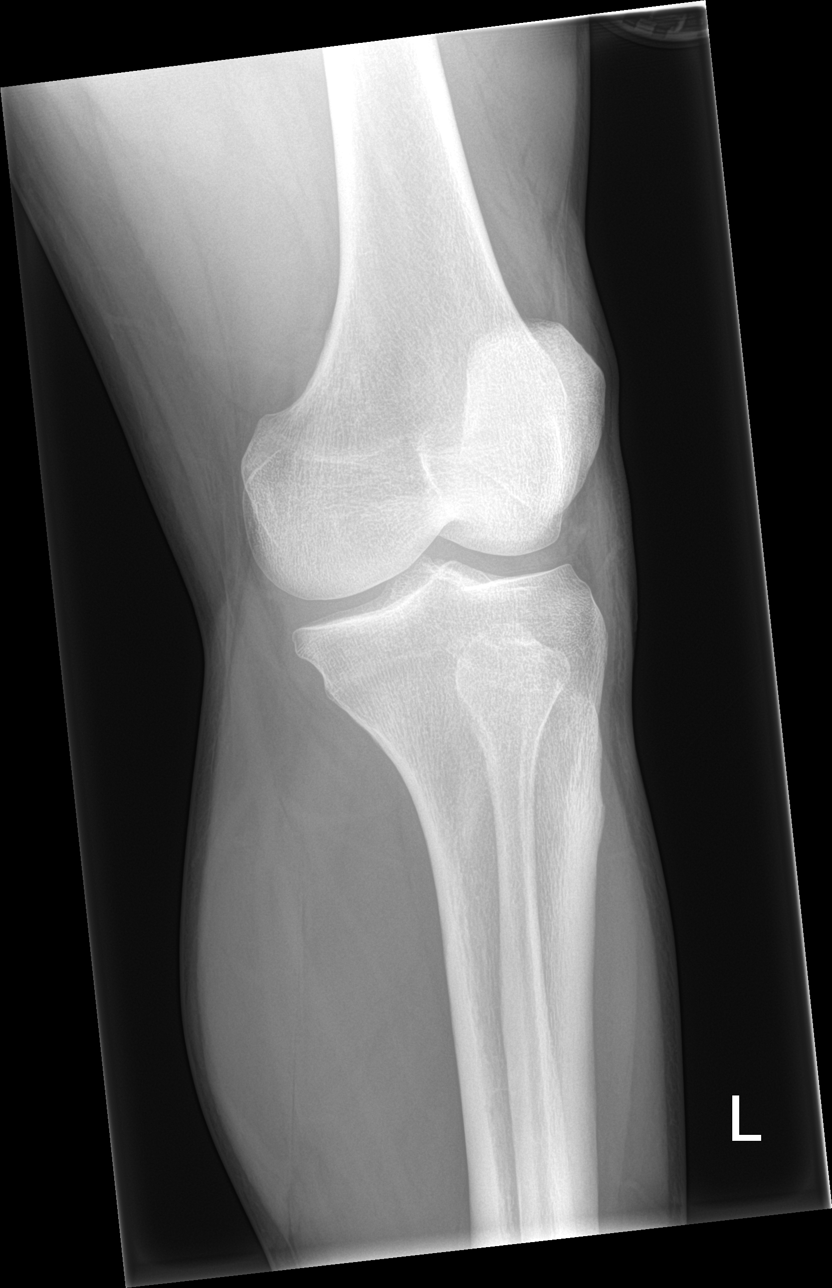

[4 of 4 positions shown; findings below may reference images not displayed]

FINDINGS: No evidence of fracture or dislocation. Trace joint effusion. No
evidence of arthropathy or other focal bone abnormality. Soft tissue
edema anteriorly.
IMPRESSION: Soft tissue edema and trace joint effusion. No acute osseous
abnormality.

## 2021-01-24 DIAGNOSIS — L858 Other specified epidermal thickening: Secondary | ICD-10-CM | POA: Diagnosis not present

## 2021-01-24 DIAGNOSIS — D225 Melanocytic nevi of trunk: Secondary | ICD-10-CM | POA: Diagnosis not present

## 2021-01-24 DIAGNOSIS — L578 Other skin changes due to chronic exposure to nonionizing radiation: Secondary | ICD-10-CM | POA: Diagnosis not present

## 2021-01-24 DIAGNOSIS — L814 Other melanin hyperpigmentation: Secondary | ICD-10-CM | POA: Diagnosis not present

## 2021-05-04 DIAGNOSIS — E785 Hyperlipidemia, unspecified: Secondary | ICD-10-CM | POA: Diagnosis not present

## 2021-05-04 DIAGNOSIS — Z125 Encounter for screening for malignant neoplasm of prostate: Secondary | ICD-10-CM | POA: Diagnosis not present

## 2021-05-11 DIAGNOSIS — Z23 Encounter for immunization: Secondary | ICD-10-CM | POA: Diagnosis not present

## 2021-05-11 DIAGNOSIS — E785 Hyperlipidemia, unspecified: Secondary | ICD-10-CM | POA: Diagnosis not present

## 2021-05-11 DIAGNOSIS — Z1331 Encounter for screening for depression: Secondary | ICD-10-CM | POA: Diagnosis not present

## 2021-05-11 DIAGNOSIS — R82998 Other abnormal findings in urine: Secondary | ICD-10-CM | POA: Diagnosis not present

## 2021-05-11 DIAGNOSIS — Z0000 Encounter for general adult medical examination without abnormal findings: Secondary | ICD-10-CM | POA: Diagnosis not present

## 2021-05-11 DIAGNOSIS — Z1339 Encounter for screening examination for other mental health and behavioral disorders: Secondary | ICD-10-CM | POA: Diagnosis not present

## 2021-05-11 DIAGNOSIS — R739 Hyperglycemia, unspecified: Secondary | ICD-10-CM | POA: Diagnosis not present

## 2021-09-04 DIAGNOSIS — R7989 Other specified abnormal findings of blood chemistry: Secondary | ICD-10-CM | POA: Diagnosis not present

## 2022-04-30 ENCOUNTER — Ambulatory Visit (INDEPENDENT_AMBULATORY_CARE_PROVIDER_SITE_OTHER): Payer: BC Managed Care – PPO | Admitting: Podiatry

## 2022-04-30 ENCOUNTER — Ambulatory Visit (INDEPENDENT_AMBULATORY_CARE_PROVIDER_SITE_OTHER): Payer: BC Managed Care – PPO

## 2022-04-30 DIAGNOSIS — M778 Other enthesopathies, not elsewhere classified: Secondary | ICD-10-CM | POA: Diagnosis not present

## 2022-04-30 DIAGNOSIS — M79671 Pain in right foot: Secondary | ICD-10-CM

## 2022-04-30 NOTE — Progress Notes (Signed)
   Chief Complaint  Patient presents with   Plantar Fasciitis    Patient came in today for right foot lateral side pain, started 6 months ago, patient has more pain in the morning, rate of pain 3 out of 10, X-rays taken today,     HPI: 47 y.o. male presenting today as a reestablish new patient for evaluation of pain and tenderness to the lateral column of the right foot.  This is been ongoing for about 6 months now.  Intermittent pain depending on activity.  Denies a history of injury or an eliciting incident that would have started the symptoms.  Past Medical History:  Diagnosis Date   Heart murmur    onset at birth no porblems   Incarcerated umbilical hernia 0/63/0160    Past Surgical History:  Procedure Laterality Date   NO PAST SURGERIES  01/05/2011   denies surgical history   OPEN REDUCTION INTERNAL FIXATION (ORIF) DISTAL RADIAL FRACTURE Right 07/04/2018   Procedure: OPEN REDUCTION INTERNAL FIXATION (ORIF) of [place] fracture;  Surgeon: Iran Planas, MD;  Location: [facility];  Service: Orthopedics;  Laterality: Right;  general anesthesia plus block   UMBILICAL HERNIA REPAIR N/A 08/29/2018   Procedure: REPAIR INCARCERATED UMBILICAL HERNIA WITH MESH;  Surgeon: Fanny Skates, MD;  Location: [facility];  Service: General;  Laterality: N/A;    No Known Allergies   Physical Exam: General: The patient is alert and oriented x3 in no acute distress.  Dermatology: Skin is warm, dry and supple bilateral lower extremities. Negative for open lesions or macerations.  Vascular: Palpable pedal pulses bilaterally. Capillary refill within normal limits.  Negative for any significant edema or erythema  Neurological: Light touch and protective threshold grossly intact  Musculoskeletal Exam: No pedal deformities noted.  There is some tenderness to palpation along the diaphysis of the fifth metatarsal right foot possibly suspicious for stress reaction  fracture  Radiographic Exam RT foot 04/30/2022:  Normal osseous mineralization. Joint spaces preserved. No fracture/dislocation/boney destruction.    Assessment: 1.  Possible stress fracture right fifth metatarsal   Plan of Care:  1. Patient evaluated. X-Rays reviewed.  2.  Advised against going barefoot or socks only around the house.  Recommend good supportive shoes and sneakers 3.  Patient declined cortisone injection or oral anti-inflammatories.  Recommend OTC Motrin as needed 4.  Continue wearing good supportive shoes and sneakers 5.  Radiographically normal x-rays.  Recommend that we simply observe for now to see if the pain resolves over time since it is very mild and intermittent and currently tolerable 6.  Return to clinic as needed      Edrick Kins, DPM [facility]  Dr. Edrick Kins, DPM    [address] [place], [place] [address]                Office [phone]  Fax [phone]

## 2023-06-23 DIAGNOSIS — J189 Pneumonia, unspecified organism: Secondary | ICD-10-CM | POA: Diagnosis not present

## 2023-06-23 DIAGNOSIS — R071 Chest pain on breathing: Secondary | ICD-10-CM | POA: Diagnosis not present

## 2023-07-24 DIAGNOSIS — E785 Hyperlipidemia, unspecified: Secondary | ICD-10-CM | POA: Diagnosis not present

## 2023-07-24 DIAGNOSIS — R739 Hyperglycemia, unspecified: Secondary | ICD-10-CM | POA: Diagnosis not present

## 2023-07-24 DIAGNOSIS — R351 Nocturia: Secondary | ICD-10-CM | POA: Diagnosis not present

## 2023-07-31 DIAGNOSIS — Z0000 Encounter for general adult medical examination without abnormal findings: Secondary | ICD-10-CM | POA: Diagnosis not present

## 2023-07-31 DIAGNOSIS — Z1331 Encounter for screening for depression: Secondary | ICD-10-CM | POA: Diagnosis not present

## 2023-07-31 DIAGNOSIS — R7989 Other specified abnormal findings of blood chemistry: Secondary | ICD-10-CM | POA: Diagnosis not present

## 2023-07-31 DIAGNOSIS — Z1339 Encounter for screening examination for other mental health and behavioral disorders: Secondary | ICD-10-CM | POA: Diagnosis not present
